# Patient Record
Sex: Female | Born: 1990 | Race: Black or African American | Hispanic: No | Marital: Single | Smoking: Never smoker
Health system: Southern US, Community
[De-identification: ages and names within clinical notes are randomized; demographics above are authoritative.]

## PROBLEM LIST (undated history)

## (undated) ENCOUNTER — Inpatient Hospital Stay (HOSPITAL_COMMUNITY): Payer: Self-pay

## (undated) DIAGNOSIS — Z789 Other specified health status: Secondary | ICD-10-CM

## (undated) HISTORY — PX: NO PAST SURGERIES: SHX2092

## (undated) HISTORY — DX: Other specified health status: Z78.9

---
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
## 2014-10-08 NOTE — L&D Delivery Note (Signed)
Patient is 24 y.o. G1P0 [redacted]w[redacted]d admitted in active labor with SROM and complete.   Delivery Note At 3:18 AM a viable female was delivered via Vaginal, Spontaneous Delivery (Presentation: LOA).  APGAR: 8, 9; weight pending.  Placenta status: Intact, Spontaneous.  Cord: 3 vessels with the following complications: None.   Anesthesia: None  Episiotomy: None Lacerations: None Est. Blood Loss (mL): 200  Upon arrival patient was complete, pushing, and head present at perineum. She had a precipitous delivery. Baby delivered without difficulty, was noted to have good tone and place on maternal abdomen for oral suctioning, drying and stimulation. Delayed cord clamping performed. Placenta delivered intact with 3V cord. Vaginal canal and perineum was inspected and hemostatic. IM Pitocin  x2 given as well as  cytotec rectally due to continued bleeding. Patient had no IV access.  Mom to postpartum.  Baby to Couplet care / Skin to Skin.   Caryl Ada, DO 05/29/2015, 3:34 AM PGY-2, [facility]

## 2014-10-11 ENCOUNTER — Emergency Department (HOSPITAL_COMMUNITY): Payer: Medicaid Other

## 2014-10-11 ENCOUNTER — Encounter (HOSPITAL_COMMUNITY): Payer: Self-pay | Admitting: *Deleted

## 2014-10-11 ENCOUNTER — Emergency Department (HOSPITAL_COMMUNITY)
Admission: EM | Admit: 2014-10-11 | Discharge: 2014-10-11 | Disposition: A | Discharge status: Home / Self Care | Payer: Medicaid Other | Attending: Emergency Medicine | Admitting: Emergency Medicine

## 2014-10-11 DIAGNOSIS — Z3401 Encounter for supervision of normal first pregnancy, first trimester: Secondary | ICD-10-CM

## 2014-10-11 DIAGNOSIS — Z3A09 9 weeks gestation of pregnancy: Secondary | ICD-10-CM | POA: Insufficient documentation

## 2014-10-11 DIAGNOSIS — R1030 Lower abdominal pain, unspecified: Secondary | ICD-10-CM | POA: Diagnosis not present

## 2014-10-11 DIAGNOSIS — R109 Unspecified abdominal pain: Secondary | ICD-10-CM

## 2014-10-11 DIAGNOSIS — O9989 Other specified diseases and conditions complicating pregnancy, childbirth and the puerperium: Secondary | ICD-10-CM | POA: Diagnosis present

## 2014-10-11 DIAGNOSIS — O26899 Other specified pregnancy related conditions, unspecified trimester: Secondary | ICD-10-CM

## 2014-10-11 LAB — CBC WITH DIFFERENTIAL/PLATELET
BASOS ABS: 0 10*3/uL (ref 0.0–0.1)
Basophils Relative: 0 % (ref 0–1)
Eosinophils Absolute: 0.1 10*3/uL (ref 0.0–0.7)
Eosinophils Relative: 1 % (ref 0–5)
HEMATOCRIT: 39.4 % (ref 36.0–46.0)
HEMOGLOBIN: 13.2 g/dL (ref 12.0–15.0)
LYMPHS PCT: 19 % (ref 12–46)
Lymphs Abs: 2 10*3/uL (ref 0.7–4.0)
MCH: 28.3 pg (ref 26.0–34.0)
MCHC: 33.5 g/dL (ref 30.0–36.0)
MCV: 84.4 fL (ref 78.0–100.0)
MONO ABS: 0.5 10*3/uL (ref 0.1–1.0)
Monocytes Relative: 4 % (ref 3–12)
NEUTROS ABS: 8.1 10*3/uL — AB (ref 1.7–7.7)
Neutrophils Relative %: 76 % (ref 43–77)
Platelets: 326 10*3/uL (ref 150–400)
RBC: 4.67 MIL/uL (ref 3.87–5.11)
RDW: 13 % (ref 11.5–15.5)
WBC: 10.7 10*3/uL — AB (ref 4.0–10.5)

## 2014-10-11 LAB — WET PREP, GENITAL
Clue Cells Wet Prep HPF POC: NONE SEEN
Trich, Wet Prep: NONE SEEN
WBC WET PREP: NONE SEEN
YEAST WET PREP: NONE SEEN

## 2014-10-11 LAB — COMPREHENSIVE METABOLIC PANEL
ALBUMIN: 4.4 g/dL (ref 3.5–5.2)
ALT: 12 U/L (ref 0–35)
ANION GAP: 9 (ref 5–15)
AST: 21 U/L (ref 0–37)
Alkaline Phosphatase: 27 U/L — ABNORMAL LOW (ref 39–117)
BILIRUBIN TOTAL: 0.5 mg/dL (ref 0.3–1.2)
BUN: 11 mg/dL (ref 6–23)
CHLORIDE: 107 meq/L (ref 96–112)
CO2: 19 mmol/L (ref 19–32)
CREATININE: 0.66 mg/dL (ref 0.50–1.10)
Calcium: 9.2 mg/dL (ref 8.4–10.5)
GFR calc Af Amer: >90 mL/min (ref >90)
Glucose, Bld: 82 mg/dL (ref 70–99)
POTASSIUM: 3.2 mmol/L — AB (ref 3.5–5.1)
Sodium: 135 mmol/L (ref 135–145)
Total Protein: 7.8 g/dL (ref 6.0–8.3)

## 2014-10-11 LAB — URINALYSIS, ROUTINE W REFLEX MICROSCOPIC
Bilirubin Urine: NEGATIVE
GLUCOSE, UA: NEGATIVE mg/dL
HGB URINE DIPSTICK: NEGATIVE
KETONES UR: NEGATIVE mg/dL
Leukocytes, UA: NEGATIVE
Nitrite: NEGATIVE
PROTEIN: NEGATIVE mg/dL
Specific Gravity, Urine: 1.027 (ref 1.005–1.030)
Urobilinogen, UA: 1 mg/dL (ref 0.0–1.0)
pH: 6.5 (ref 5.0–8.0)

## 2014-10-11 LAB — POC URINE PREG, ED: Preg Test, Ur: POSITIVE — AB

## 2014-10-11 LAB — HCG, QUANTITATIVE, PREGNANCY: HCG, BETA CHAIN, QUANT, S: 9940 m[IU]/mL — AB (ref <5)

## 2014-10-11 IMAGING — US US OB COMP LESS 14 WK
1 series · 13 of 28 positions shown · non-contrast
Comparison: None.

CLINICAL DATA: Lower pelvic pain. History of ovarian cysts. Nine
weeks and 1 day pregnant by last menstrual period. Quantitative beta
HCG [DATE].

EXAM:
OBSTETRIC <14 WK US AND TRANSVAGINAL OB US
TECHNIQUE: Both transabdominal and transvaginal ultrasound examinations were
performed for complete evaluation of the gestation as well as the
maternal uterus, adnexal regions, and pelvic cul-de-sac.
Transvaginal technique was performed to assess early pregnancy.

[Series 1: us ob comp less 14 wk · 0.18mm/px · 13 of 45 slices shown]
[im 2/45]
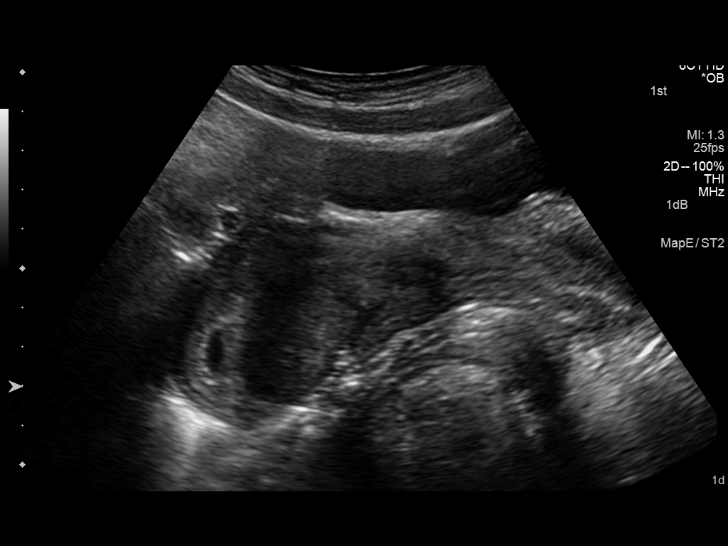
[im 5/45]
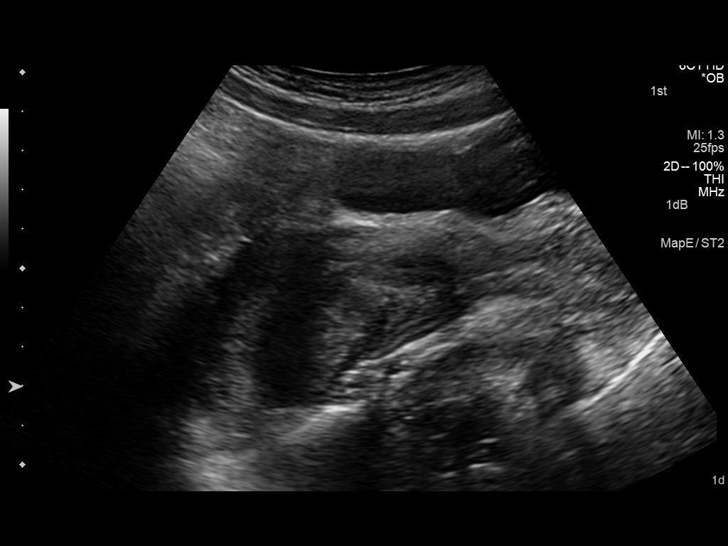
[im 9/45]
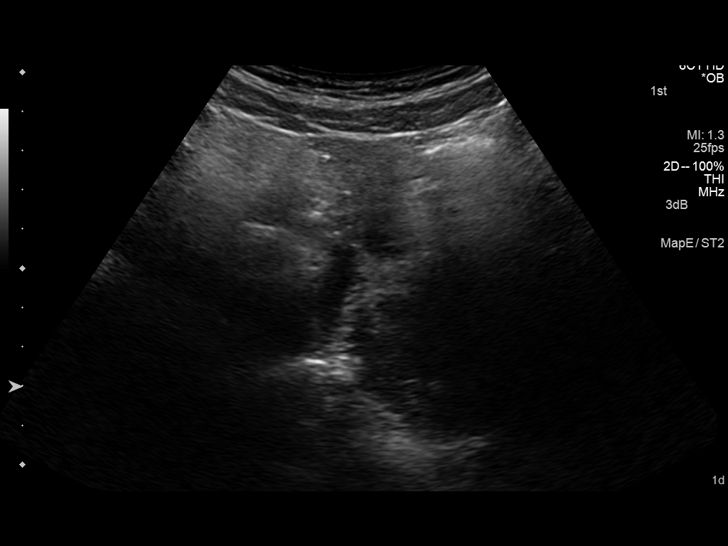
[im 12/45]
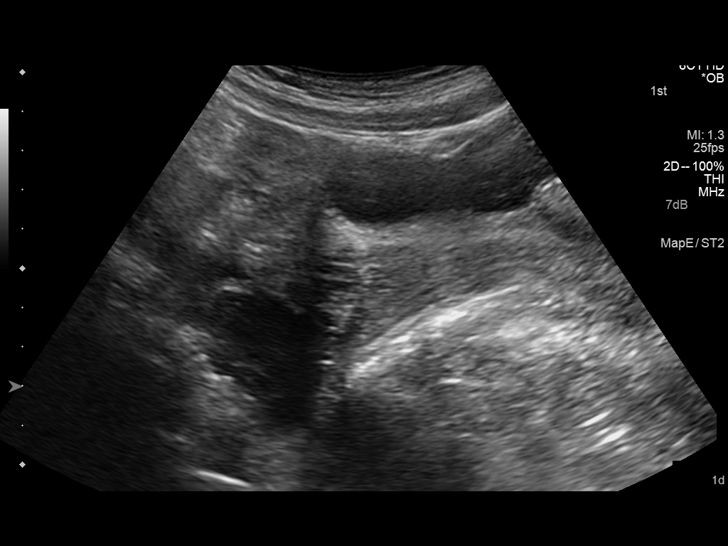
[im 15/45]
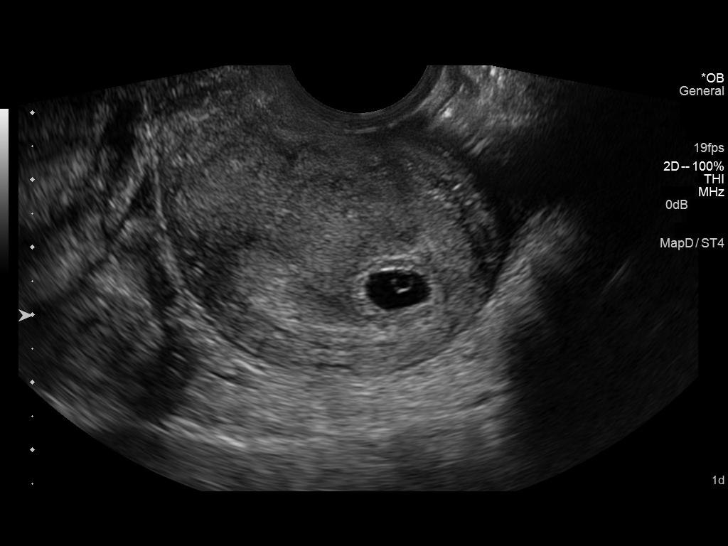
[im 18/45]
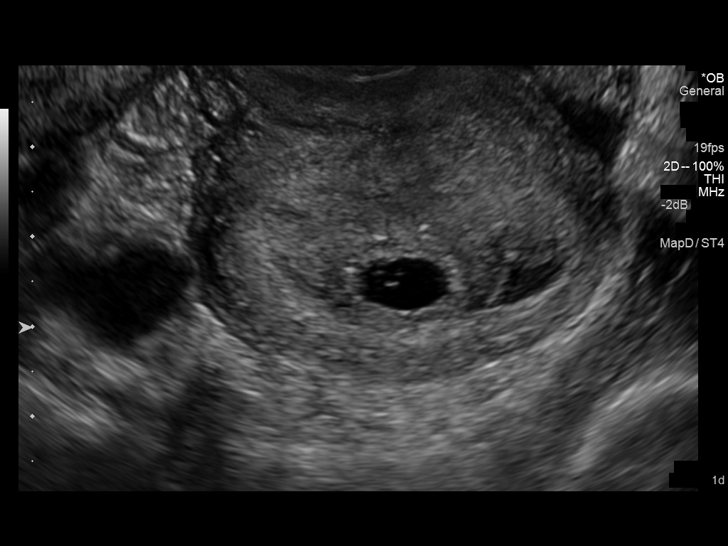
[im 23/45]
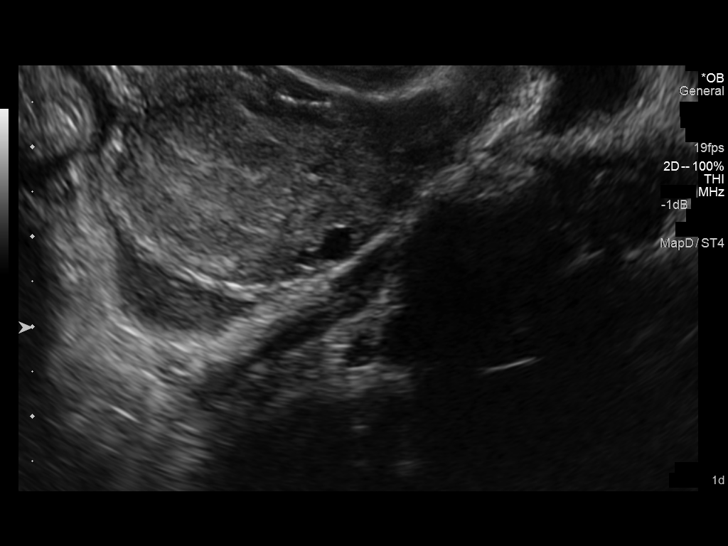
[im 27/45]
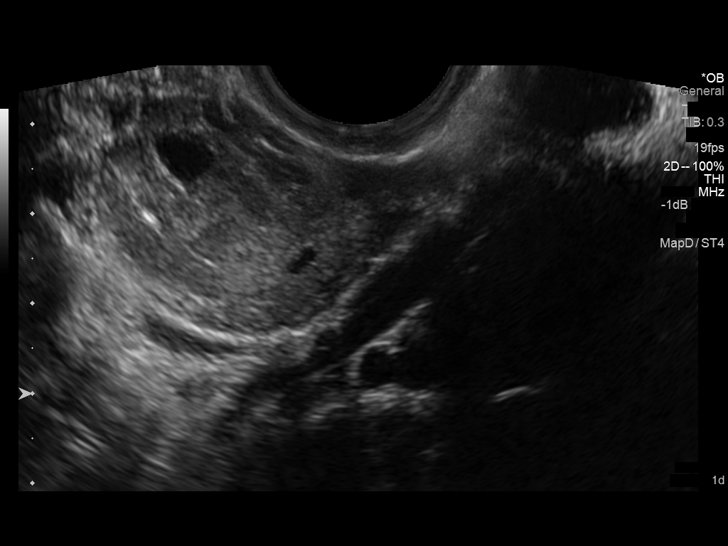
[im 30/45]
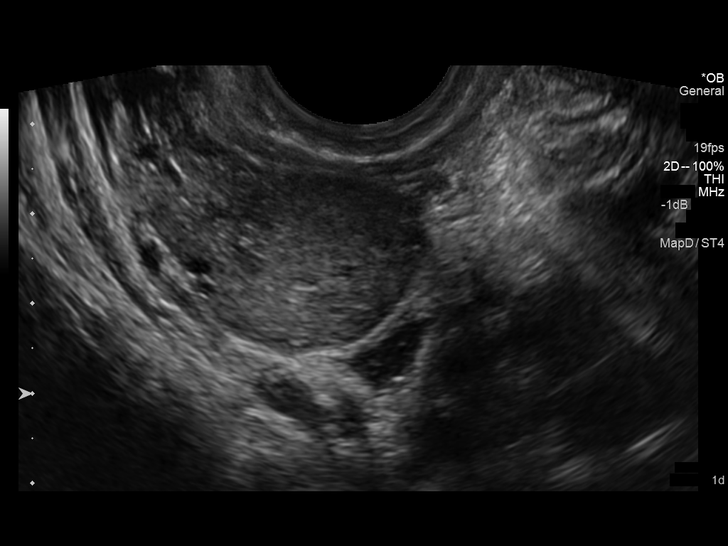
[im 33/45]
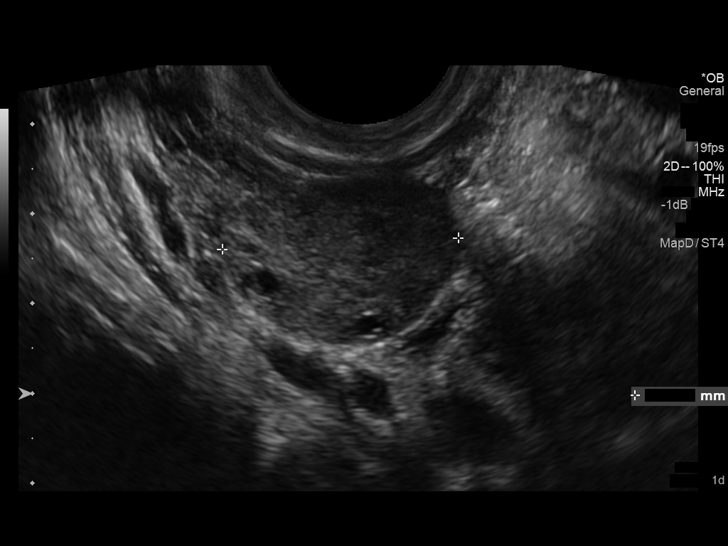
[im 36/45]
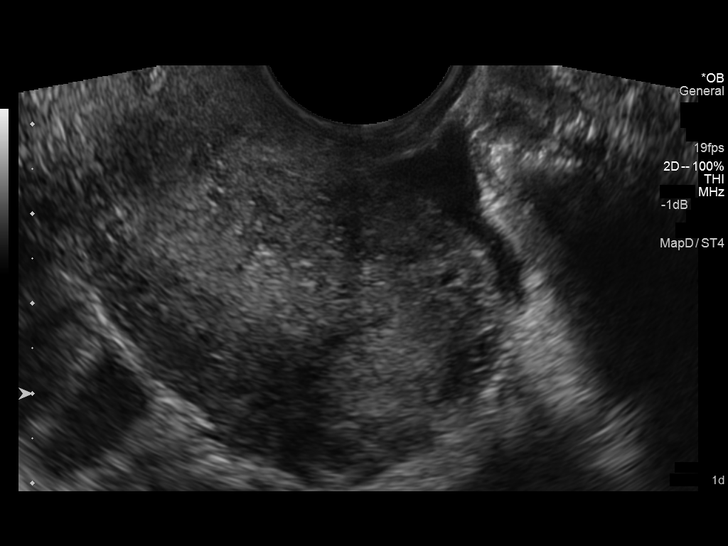
[im 40/45]
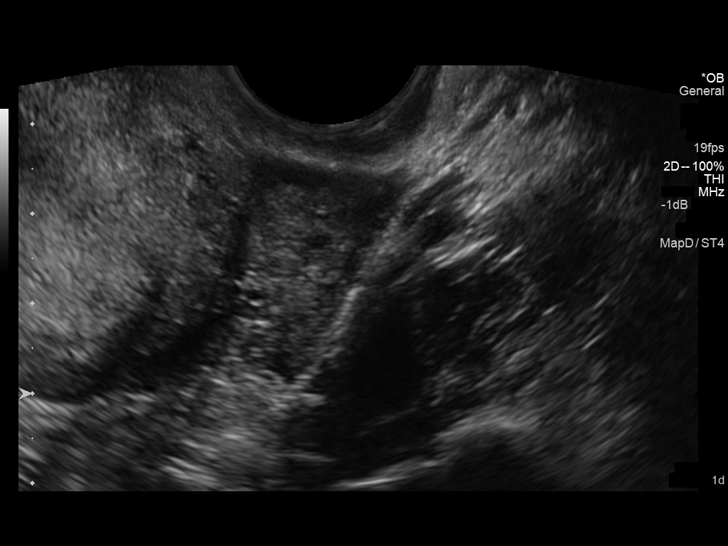
[im 43/45]
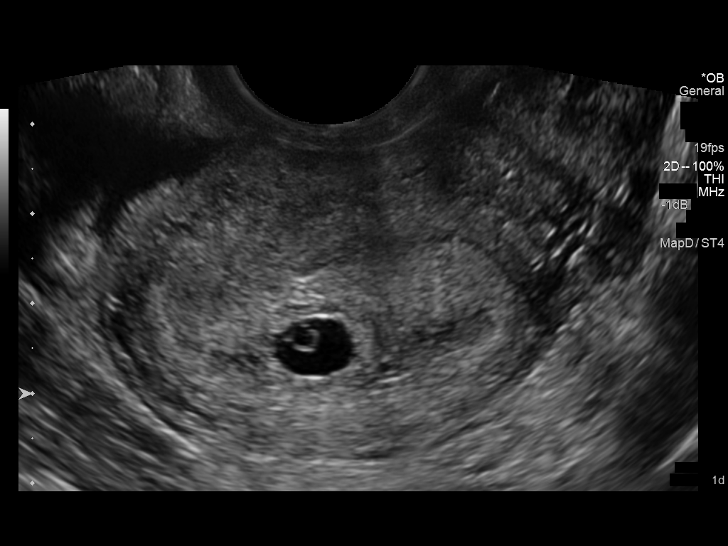

[13 of 28 positions shown; findings below may reference images not displayed]

FINDINGS: Intrauterine gestational sac: Visualized/normal in shape.

Yolk sac:  Visualized/normal in shape

Embryo:  Not visualized

Cardiac Activity: Not visualized

MSD:  9.4  mm   5 w   5  d                 US EDC: [DATE].

Maternal uterus/adnexae: Small subchorionic hemorrhage. Normal
appearing ovaries. Trace free peritoneal fluid.
IMPRESSION: 1. Intrauterine gestational sac with an estimated gestational age of
5 weeks and 5 days. There is no fetal pole visible at this time.
This most likely represents a normal early intrauterine pregnancy.
Recommend follow-up quantitative B-HCG levels and follow-up US in 14
days to confirm and assess viability. This recommendation follows
SRU consensus guidelines: Diagnostic Criteria for Nonviable
Pregnancy Early in the First Trimester. N Engl J Med [YM];
[DATE].
2. Small subchorionic hemorrhage.

## 2014-10-11 MED ORDER — ACETAMINOPHEN 500 MG PO TABS
1000.0000 mg | ORAL_TABLET | Freq: Once | ORAL | Status: AC
  Administered 2014-10-11: 1000 mg via ORAL
  Filled 2014-10-11: qty 2

## 2014-10-11 MED ORDER — PRENATAL COMPLETE 14-0.4 MG PO TABS: 1.0000 | ORAL_TABLET | Freq: Every day | ORAL | Status: DC

## 2014-10-11 NOTE — Discharge Instructions (Signed)
Follow with OB/GYN as soon as possible.   Take acetaminophen (Tylenol) up to 975 mg (this is normally 3 over-the-counter pills) up to 3 times a day. Do not drink alcohol. Make sure your other medications do not contain acetaminophen (Read the labels!)  Do NOT take any NSAIDs, such as Aspirin, Motrin, Ibuprofen, Aleve, Naproxen etc. Only take Tylenol for pain. Return to the emergency room  for any severe abdominal pain, increasing vaginal bleeding, passing out or repeated vomiting.  Please go to the [facility] office in [facility] to apply for coverage. Alternatively, you can could go to the DSS office in [facility] to apply for emergency coverage.   Do not hesitate to return to the emergency room for any new, worsening or concerning symptoms.  Please obtain primary care using resource guide below. But the minute you were seen in the emergency room and that they will need to obtain records for further outpatient management.   Abdominal Pain During Pregnancy Belly (abdominal) pain is common during pregnancy. Most of the time, it is not a serious problem. Other times, it can be a sign that something is wrong with the pregnancy. Always tell your doctor if you have belly pain. HOME CARE Monitor your belly pain for any changes. The following actions may help you feel better:  Do not have sex (intercourse) or put anything in your vagina until you feel better.  Rest until your pain stops.  Drink clear fluids if you feel sick to your stomach (nauseous). Do not eat solid food until you feel better.  Only take medicine as told by your doctor.  Keep all doctor visits as told. GET HELP RIGHT AWAY IF:   You are bleeding, leaking fluid, or pieces of tissue come out of your vagina.  You have more pain or cramping.  You keep throwing up (vomiting).  You have pain when you pee (urinate) or have blood in your pee.  You have a fever.  You do not feel your baby moving as much.  You feel very weak  or feel like passing out.  You have trouble breathing, with or without belly pain.  You have a very bad headache and belly pain.  You have fluid leaking from your vagina and belly pain.  You keep having watery poop (diarrhea).  Your belly pain does not go away after resting, or the pain gets worse. MAKE SURE YOU:   Understand these instructions.  Will watch your condition.  Will get help right away if you are not doing well or get worse. Document Released: 09/12/2009 Document Revised: 05/27/2013 Document Reviewed: 04/23/2013 [facility] Patient Information 2015 [place], [place]. This information is not intended to replace advice given to you by your health care provider. Make sure you discuss any questions you have with your health care provider.   Emergency Department Resource Guide 1) Find a Doctor and Pay Out of Pocket Although you won't have to find out who is covered by your insurance plan, it is a good idea to ask around and get recommendations. You will then need to call the office and see if the doctor you have chosen will accept you as a new patient and what types of options they offer for patients who are self-pay. Some doctors offer discounts or will set up payment plans for their patients who do not have insurance, but you will need to ask so you aren't surprised when you get to your appointment.  2) Contact Your Local Health Department Not all  health departments have doctors that can see patients for sick visits, but many do, so it is worth a call to see if yours does. If you don't know where your local health department is, you can check in your phone book. The CDC also has a tool to help you locate your state's health department, and many state websites also have listings of all of their local health departments.  3) Find a Walk-in Clinic If your illness is not likely to be very severe or complicated, you may want to try a walk in clinic. These are popping up all over the  country in pharmacies, drugstores, and shopping centers. They're usually staffed by nurse practitioners or physician assistants that have been trained to treat common illnesses and complaints. They're usually fairly quick and inexpensive. However, if you have serious medical issues or chronic medical problems, these are probably not your best option.  No Primary Care Doctor: - Call Health Connect at  [phone] - they can help you locate a primary care doctor that  accepts your insurance, provides certain services, etc. - Physician Referral Service- [phone]  Chronic Pain Problems: Organization         Address  Phone   Notes  [facility]  [phone] Patients need to be referred by their primary care doctor.   Medication Assistance: Organization         Address  Phone   Notes  [facility] Medication [facility] [address] [place], [place] [address] [phone] --Must be a resident of [facility] -- Must have NO insurance coverage whatsoever (no Medicaid/ Medicare, etc.) -- The pt. MUST have a primary care doctor that directs their care regularly and follows them in the community   [facility]ist  [phone]   [employer]  [phone]    Agencies that provide inexpensive medical care: Organization         Address  Phone   Notes  [facility]  [phone]   [facility]    [phone]   [facility] [address] [place], [place] [address] [phone]   [facility] [address], [place] [phone]   Planned Parenthood    [phone]   [facility]    [phone]   [facility]  [address], [place] Phone:  [phone], Fax:  [phone] Hours of Operation:  9 am - 6 pm, M-F.  Also accepts Medicaid/Medicare and self-pay.  [facility]  [address], [place] Phone: [phone], Fax: [phone]. Hours of Operation:  8:30 am - 5:30 pm, M-F.  Also accepts Medicaid and self-pay.  [facility] [place] [address], [place] Phone: [phone]   [facility] [address] Natasha Bence [place], [place] [phone], Ext. [phone] Mondays & Thursdays: 7-9 AM.  First 15 patients are seen on a first come, first serve basis.    Medicaid-accepting [facility] Providers:  Organization         Address  Phone   Notes  [facility] [address], [place] [phone] Also accepts self-pay patients.  [facility] [address] Laurell Josephs [place], [place]  [phone]   [facility] [address], [place] [phone]   [facility] [address] [place] [phone]   Renaye Rakers [address], [place]   [phone] Only accepts [place] Access [place] patients after they have their name applied to their card.   Self-Pay (no insurance) in [facility]:  Organization         Address  Phone   Notes  Sickle Cell Patients, [facility] [address] [place], [place] [phone]   [facility] [address] [place], [place] [phone]   [facility] [place]  [address],  [phone]   [facility]/Dr. Osei-Bonsu  [address] [place] or [address], [place] [phone] Phone number for both [place] and [place] locations is the same.  [facility] [address] [place] [phone]   [facility] [address], [place] or [address] [phone]   [facility] [address], [place] [phone], phone; [phone], fax Sees patients 1st and 3rd Saturday of every month.  Must  not qualify for public or private insurance (i.e. Medicaid, Medicare, [place] Health Choice, Veterans' Benefits)  Household income should be no more than 200% of the poverty level The clinic cannot treat you if you are pregnant or think you are pregnant  Sexually transmitted diseases are not treated at the clinic.    Dental Care: Organization         Address  Phone  Notes  [facility] [address] [place], [place] [phone] Accepts children up to age 57 who are enrolled in [place] or [place] Health Choice; pregnant women with a Medicaid card; and children who have applied for Medicaid or [place] Health Choice, but were declined, whose parents can pay a reduced fee at time of service.  [facility] Department of [facility]  [address], [place] [phone] Accepts children up to age 66 who are enrolled in [place] or [place] Health Choice; pregnant women with a Medicaid card; and children who have applied for Medicaid or [place] Health Choice, but were declined, whose parents can pay a reduced fee at time of service.  [facility]  [address] [place], [place] [phone] Patients are seen by appointment only. Walk-ins are not accepted. [facility] will see patients 35 years of age and older. Monday - Tuesday (8am-5pm) Most Wednesdays (8:30-5pm) $30 per visit, cash only  [facility] Adult Dental Access PROGRAM  [address], [facility] [phone] Patients are seen by appointment only. Walk-ins are not accepted. [facility] will see patients 40 years of age and older. One Wednesday Evening (Monthly: Volunteer Based).  $30 per visit, cash only  [employer]  [phone] for adults; Children under age 11, call Graduate Pediatric Dentistry at [phone]. Children aged 38-14, please call [phone] to request a pediatric application.  Dental services are  provided in all areas of dental care including fillings, crowns and bridges, complete and partial dentures, implants, gum treatment, root canals, and extractions. Preventive care is also provided. Treatment is provided to both adults and children. Patients are selected via a lottery and there is often a waiting list.   [facility] [address] [place]  [phone] [email or web address]   [facility] [address] [place], [place] [phone], Ext. [phone] Second and Fourth Thursday of each month, opens at 6:30 AM; Clinic ends at 9 AM.  Patients are seen on a first-come first-served basis, and a limited number are seen during each clinic.   [facility]  [address] Ether Griffins [place], [place] [phone]   Eligibility Requirements You must have lived in [place], [place], or [place] counties for at least the last three months.   You cannot be eligible for state or federal sponsored [place], including [employer], [place], or [employer].   You generally cannot be eligible for healthcare insurance through your employer.    How to apply: Eligibility screenings are held every Tuesday and Wednesday afternoon from 1:00 pm until 4:00 pm. You do not need an appointment for the interview!  [facility] [address] [place], [place] [phone]   [facility]  [phone]   [facility] Health [facility]  [phone]   [facility]  [phone]    Behavioral Health Resources in the Community: Intensive Outpatient Programs Organization         Address  Phone  Notes  [facility] [address] [place], [place] [phone]   [facility] Outpatient [address] [place], [place] [phone]   [facility] [address] [place], [place]  [phone]   [facility] Mental Health [address],  [place], [place]  [phone] or [phone]   Substance Abuse Resources Organization         Address  Phone  Notes  [facility]  [phone]   [facility]  [phone]   [facility] [place]  [phone]   Floydene Flock  [phone]   Residential & Outpatient Substance Abuse Program  [phone]   Psychological Services Organization         Address  Phone  Notes  [facility]  [phone]   [facility] Services  [phone]   [facility] Mental Health [address], [place] [phone] or [phone]    Mobile Crisis Teams Organization         Address  Phone  Notes  [facility], Mobile Crisis Care Unit  [phone]   [facility]  [address] [place], [place] [phone]   Doristine Locks [address] [place] [phone]    Self-Help/Support Groups Organization         Address  Phone             Notes  [facility] [place] - variety of support groups  336- I7437963 Call for more information  Narcotics Anonymous (NA), [facility] [address], [employer] [place]  2 meetings at this location   Statistician         Address  Phone  Notes  [facility] [address],    [place]  [phone]   [facility]  [address] [place] [address], [place], [place] [phone]   [facility] Treatment Facility [address] [place], [place] [phone] Admissions: 8am-3pm M-F  [facility] [address]    [place], [place] [phone]   [facility] [address] Starling Manns [place], [place] [phone]   [facility] [address]  [place], [place] [phone]   [facility] - Intensive Outpatient [phone] Laurell Josephs  [address], [place], [place] [phone]   [facility] (Addiction [facility] [address] [place]  [place], [place] [phone] or  [phone]   [facility] ([facility]) [address], [place], [place] [phone] Accepts Medicaid  [facility] [address],  [place] [phone] Substance Abuse/Addiction Treatment   [facility] Organization         Address  Phone  Notes  [facility]  [phone]   Angie Fava, PhD [address] Ervin Knack [place], [place]   [phone] or [phone]   [facility] Behavioral   [address] [place], [place] [phone]   [facility] [address], [place], [place] [phone] Insurance/Medicaid/sponsorship through [facility] and Families [address]                                    [place], [place] [phone] Therapy/tele-psych/case  [facility] [address], [place] [phone]    Dr. Lolly Mustache  [phone]   [facility]  [employer] [facility] Dept. 1) [address], [place] 2) [address] [place] 3)  [address], [place] [phone]  [phone]   [facility] Child Abuse Hotline (231) 127-9783 or [phone] (After Hours)

## 2014-10-11 NOTE — ED Notes (Signed)
Pt reports having lower abd cramping x 2 weeks. Pt is approx 5-[redacted] weeks pregnant. Denies any vaginal discharge, bleeding or pain with urination.

## 2014-10-11 NOTE — ED Provider Notes (Signed)
CSN: [id number]     Arrival date & time 10/11/14  1534 History   First MD Initiated Contact with Patient 10/11/14 1810     Chief Complaint  Patient presents with  . Abdominal Pain     (Consider location/radiation/quality/duration/timing/severity/associated sxs/prior Treatment) HPI  Iyanni Hepp is a 24 y.o. female Prima para complaining of midline lower abdominal pain which she rates at 7 out of 10 x2 weeks, last menstrual period was in mid November. Patient has positive urine home pregnancy test. Patient has been taking naproxen at home for pain control. She denies vaginal discharge, vaginal bleeding, dysuria, hematuria, urinary frequency, vomiting, syncope, fever, chills, chest pain, shortness of breath, headaches.   History reviewed. No pertinent past medical history. History reviewed. No pertinent past surgical history. History reviewed. No pertinent family history. History  Substance Use Topics  . Smoking status: Not on file  . Smokeless tobacco: Not on file  . Alcohol Use: No   OB History    No data available     Review of Systems  10 systems reviewed and found to be negative, except as noted in the HPI.   Allergies  Review of patient's allergies indicates no known allergies.  Home Medications   Prior to Admission medications   Medication Sig Start Date End Date Taking? Authorizing Provider  Prenatal Vit-Fe Fumarate-FA (PRENATAL COMPLETE) 14-0.4 MG TABS Take 1 tablet by mouth daily. 10/11/14   Jacquilyn Seldon, PA-C   BP 114/68 mmHg  Pulse 76  Temp(Src) 98.1 F (36.7 C)  Resp 16  Wt 125 lb (56.7 kg)  SpO2 99%  LMP 08/08/2014 Physical Exam  Constitutional: She is oriented to person, place, and time. She appears well-developed and well-nourished. No distress.  HENT:  Head: Normocephalic.  Mouth/Throat: Oropharynx is clear and moist.  Eyes: Conjunctivae and EOM are normal. Pupils are equal, round, and reactive to light.  Neck: Normal range of motion.   Cardiovascular: Normal rate.   Pulmonary/Chest: Effort normal and breath sounds normal. No stridor.  Abdominal: Soft. Bowel sounds are normal. She exhibits no distension and no mass. There is no tenderness. There is no rebound and no guarding.  Genitourinary:  Pelvic exam is chaperoned by nurse Brittney: No rashes or lesions, no abnormal vaginal discharge, no cervical motion or adnexal tenderness.  Musculoskeletal: Normal range of motion. She exhibits no edema.  Neurological: She is alert and oriented to person, place, and time.  Psychiatric: She has a normal mood and affect.  Nursing note and vitals reviewed.   ED Course  Procedures (including critical care time) Labs Review Labs Reviewed  HCG, QUANTITATIVE, PREGNANCY - Abnormal; Notable for the following:    hCG, Beta Chain, Quant, S 9940 (*)    All other components within normal limits  CBC WITH DIFFERENTIAL - Abnormal; Notable for the following:    WBC 10.7 (*)    Neutro Abs 8.1 (*)    All other components within normal limits  COMPREHENSIVE METABOLIC PANEL - Abnormal; Notable for the following:    Potassium 3.2 (*)    Alkaline Phosphatase 27 (*)    All other components within normal limits  POC URINE PREG, ED - Abnormal; Notable for the following:    Preg Test, Ur POSITIVE (*)    All other components within normal limits  WET PREP, GENITAL  GC/CHLAMYDIA PROBE AMP  URINALYSIS, ROUTINE W REFLEX MICROSCOPIC    Imaging Review US Ob Comp Less 14 Wks  10/11/2014   CLINICAL DATA:  Lower pelvic  pain. History of ovarian cysts. Nine weeks and 1 day pregnant by last menstrual period. Quantitative beta HCG 9,940.  EXAM: OBSTETRIC <14 WK [place] AND TRANSVAGINAL OB US  TECHNIQUE: Both transabdominal and transvaginal ultrasound examinations were performed for complete evaluation of the gestation as well as the maternal uterus, adnexal regions, and pelvic cul-de-sac. Transvaginal technique was performed to assess early pregnancy.  COMPARISON:   None.  FINDINGS: Intrauterine gestational sac: Visualized/normal in shape.  Yolk sac:  Visualized/normal in shape  Embryo:  Not visualized  Cardiac Activity: Not visualized  MSD:  9.4  mm   5 w   5  d                 [place] EDC: 06/08/2015.  Maternal uterus/adnexae: Small subchorionic hemorrhage. Normal appearing ovaries. Trace free peritoneal fluid.  IMPRESSION: 1. Intrauterine gestational sac with an estimated gestational age of [redacted] weeks and 5 days. There is no fetal pole visible at this time. This most likely represents a normal early intrauterine pregnancy. Recommend follow-up quantitative B-HCG levels and follow-up US in 14 days to confirm and assess viability. This recommendation follows SRU consensus guidelines: Diagnostic Criteria for Nonviable Pregnancy Early in the First Trimester. Malva Limes Med 2013; 161:0960-45. 2. Small subchorionic hemorrhage.   Electronically Signed   By: Gordan Payment M.D.   On: 10/11/2014 20:15   US Ob Transvaginal  10/11/2014   CLINICAL DATA:  Lower pelvic pain. History of ovarian cysts. Nine weeks and 1 day pregnant by last menstrual period. Quantitative beta HCG 9,940.  EXAM: OBSTETRIC <14 WK [place] AND TRANSVAGINAL OB US  TECHNIQUE: Both transabdominal and transvaginal ultrasound examinations were performed for complete evaluation of the gestation as well as the maternal uterus, adnexal regions, and pelvic cul-de-sac. Transvaginal technique was performed to assess early pregnancy.  COMPARISON:  None.  FINDINGS: Intrauterine gestational sac: Visualized/normal in shape.  Yolk sac:  Visualized/normal in shape  Embryo:  Not visualized  Cardiac Activity: Not visualized  MSD:  9.4  mm   5 w   5  d                 [place] EDC: 06/08/2015.  Maternal uterus/adnexae: Small subchorionic hemorrhage. Normal appearing ovaries. Trace free peritoneal fluid.  IMPRESSION: 1. Intrauterine gestational sac with an estimated gestational age of [redacted] weeks and 5 days. There is no fetal pole visible at this time. This  most likely represents a normal early intrauterine pregnancy. Recommend follow-up quantitative B-HCG levels and follow-up US in 14 days to confirm and assess viability. This recommendation follows SRU consensus guidelines: Diagnostic Criteria for Nonviable Pregnancy Early in the First Trimester. Malva Limes Med 2013; 409:8119-14. 2. Small subchorionic hemorrhage.   Electronically Signed   By: Gordan Payment M.D.   On: 10/11/2014 20:15     EKG Interpretation None      MDM   Final diagnoses:  Abdominal pain affecting pregnancy  Pregnancy, first, first trimester    Filed Vitals:   10/11/14 1900 10/11/14 1915 10/11/14 1930 10/11/14 2030  BP: 115/72 138/69 112/68 114/68  Pulse: 88 83 92 76  Temp:      Resp:  16  16  Weight:      SpO2: 100% 100% 100% 99%    Medications  acetaminophen (TYLENOL) tablet 1,000 mg (1,000 mg Oral Given 10/11/14 2027)    Inice Sanluis is a pleasant 24 y.o. female presenting with midline lower abdominal pain onset 2 weeks ago. Patient is approximately  [redacted] weeks pregnant, has not had OB/GYN evaluation. Serial abdominal exams are benign, ultrasound shows intrauterine gestational sac no fetal pole; with subchorionic hemorrhage.  Discussed with patient that she will need a recheck blood level in 14 days, I have instructed her to go to [facility] for this, or if she has any issues to return to the ED. Patient verbalizes her understanding.  UA and wet prep with no signs of infection. GC and chlamydia pending. I have advised her that she will need to ensure that we have a good phone number to reach her at as these tests results will come back several days from now.  OB/GYN consult from Dr. Adrian Blackwater appreciated: We have discussed the small subchorionic hemorrhage he has confirmed that there is no intervention or specific guidance that can be given on this at this time. Patient will follow at the [facility] outpatient clinic. I will start her on prenatal vitamins. Advised her  not to take any NSAIDs, only APAP for pain.   Evaluation does not show pathology that would require ongoing emergent intervention or inpatient treatment. Pt is hemodynamically stable and mentating appropriately. Discussed findings and plan with patient/guardian, who agrees with care plan. All questions answered. Return precautions discussed and outpatient follow up given.   New Prescriptions   PRENATAL VIT-FE FUMARATE-FA (PRENATAL COMPLETE) 14-0.4 MG TABS    Take 1 tablet by mouth daily.         Wynetta Emery, PA-C 10/11/14 2156  Gerhard Munch, MD 10/11/14 2312

## 2014-10-13 LAB — GC/CHLAMYDIA PROBE AMP
CT Probe RNA: NEGATIVE
GC Probe RNA: NEGATIVE

## 2014-11-24 ENCOUNTER — Ambulatory Visit (INDEPENDENT_AMBULATORY_CARE_PROVIDER_SITE_OTHER): Payer: Medicaid Other | Admitting: Obstetrics & Gynecology

## 2014-11-24 ENCOUNTER — Encounter: Payer: Self-pay | Admitting: Advanced Practice Midwife

## 2014-11-24 VITALS — BP 125/82 | HR 109 | Ht 67.0 in | Wt 124.2 lb

## 2014-11-24 DIAGNOSIS — Z3491 Encounter for supervision of normal pregnancy, unspecified, first trimester: Secondary | ICD-10-CM

## 2014-11-24 DIAGNOSIS — Z3490 Encounter for supervision of normal pregnancy, unspecified, unspecified trimester: Secondary | ICD-10-CM | POA: Diagnosis not present

## 2014-11-24 DIAGNOSIS — Z124 Encounter for screening for malignant neoplasm of cervix: Secondary | ICD-10-CM | POA: Diagnosis not present

## 2014-11-24 DIAGNOSIS — Z118 Encounter for screening for other infectious and parasitic diseases: Secondary | ICD-10-CM | POA: Diagnosis not present

## 2014-11-24 DIAGNOSIS — Z113 Encounter for screening for infections with a predominantly sexual mode of transmission: Secondary | ICD-10-CM | POA: Diagnosis not present

## 2014-11-24 LAB — POCT URINALYSIS DIP (DEVICE)
BILIRUBIN URINE: NEGATIVE
Glucose, UA: NEGATIVE mg/dL
HGB URINE DIPSTICK: NEGATIVE
KETONES UR: NEGATIVE mg/dL
Leukocytes, UA: NEGATIVE
Nitrite: NEGATIVE
PH: 7 (ref 5.0–8.0)
Protein, ur: 30 mg/dL — AB
SPECIFIC GRAVITY, URINE: 1.02 (ref 1.005–1.030)
Urobilinogen, UA: 1 mg/dL (ref 0.0–1.0)

## 2014-11-24 NOTE — Progress Notes (Signed)
Not taking prenatal vitamin makes her feel sick.

## 2014-11-24 NOTE — Patient Instructions (Signed)
First Trimester of Pregnancy The first trimester of pregnancy is from week 1 until the end of week 12 (months 1 through 3). A week after a sperm fertilizes an egg, the egg will implant on the wall of the uterus. This embryo will begin to develop into a baby. Genes from you and your partner are forming the baby. The female genes determine whether the baby is a boy or a girl. At 6-8 weeks, the eyes and face are formed, and the heartbeat can be seen on ultrasound. At the end of 12 weeks, all the baby's organs are formed.  Now that you are pregnant, you will want to do everything you can to have a healthy baby. Two of the most important things are to get good prenatal care and to follow your health care provider's instructions. Prenatal care is all the medical care you receive before the baby's birth. This care will help prevent, find, and treat any problems during the pregnancy and childbirth. BODY CHANGES Your body goes through many changes during pregnancy. The changes vary from woman to woman.   You may gain or lose a couple of pounds at first.  You may feel sick to your stomach (nauseous) and throw up (vomit). If the vomiting is uncontrollable, call your health care provider.  You may tire easily.  You may develop headaches that can be relieved by medicines approved by your health care provider.  You may urinate more often. Painful urination may mean you have a bladder infection.  You may develop heartburn as a result of your pregnancy.  You may develop constipation because certain hormones are causing the muscles that push waste through your intestines to slow down.  You may develop hemorrhoids or swollen, bulging veins (varicose veins).  Your breasts may begin to grow larger and become tender. Your nipples may stick out more, and the tissue that surrounds them (areola) may become darker.  Your gums may bleed and may be sensitive to brushing and flossing.  Dark spots or blotches (chloasma,  mask of pregnancy) may develop on your face. This will likely fade after the baby is born.  Your menstrual periods will stop.  You may have a loss of appetite.  You may develop cravings for certain kinds of food.  You may have changes in your emotions from day to day, such as being excited to be pregnant or being concerned that something may go wrong with the pregnancy and baby.  You may have more vivid and strange dreams.  You may have changes in your hair. These can include thickening of your hair, rapid growth, and changes in texture. Some women also have hair loss during or after pregnancy, or hair that feels dry or thin. Your hair will most likely return to normal after your baby is born. WHAT TO EXPECT AT YOUR PRENATAL VISITS During a routine prenatal visit:  You will be weighed to make sure you and the baby are growing normally.  Your blood pressure will be taken.  Your abdomen will be measured to track your baby's growth.  The fetal heartbeat will be listened to starting around week 10 or 12 of your pregnancy.  Test results from any previous visits will be discussed. Your health care provider may ask you:  How you are feeling.  If you are feeling the baby move.  If you have had any abnormal symptoms, such as leaking fluid, bleeding, severe headaches, or abdominal cramping.  If you have any questions. Other tests   that may be performed during your first trimester include:  Blood tests to find your blood type and to check for the presence of any previous infections. They will also be used to check for low iron levels (anemia) and Rh antibodies. Later in the pregnancy, blood tests for diabetes will be done along with other tests if problems develop.  Urine tests to check for infections, diabetes, or protein in the urine.  An ultrasound to confirm the proper growth and development of the baby.  An amniocentesis to check for possible genetic problems.  Fetal screens for  spina bifida and Down syndrome.  You may need other tests to make sure you and the baby are doing well. HOME CARE INSTRUCTIONS  Medicines  Follow your health care provider's instructions regarding medicine use. Specific medicines may be either safe or unsafe to take during pregnancy.  Take your prenatal vitamins as directed.  If you develop constipation, try taking a stool softener if your health care provider approves. Diet  Eat regular, well-balanced meals. Choose a variety of foods, such as meat or vegetable-based protein, fish, milk and low-fat dairy products, vegetables, fruits, and whole grain breads and cereals. Your health care provider will help you determine the amount of weight gain that is right for you.  Avoid raw meat and uncooked cheese. These carry germs that can cause birth defects in the baby.  Eating four or five small meals rather than three large meals a day may help relieve nausea and vomiting. If you start to feel nauseous, eating a few soda crackers can be helpful. Drinking liquids between meals instead of during meals also seems to help nausea and vomiting.  If you develop constipation, eat more high-fiber foods, such as fresh vegetables or fruit and whole grains. Drink enough fluids to keep your urine clear or pale yellow. Activity and Exercise  Exercise only as directed by your health care provider. Exercising will help you:  Control your weight.  Stay in shape.  Be prepared for labor and delivery.  Experiencing pain or cramping in the lower abdomen or low back is a good sign that you should stop exercising. Check with your health care provider before continuing normal exercises.  Try to avoid standing for long periods of time. Move your legs often if you must stand in one place for a long time.  Avoid heavy lifting.  Wear low-heeled shoes, and practice good posture.  You may continue to have sex unless your health care provider directs you  otherwise. Relief of Pain or Discomfort  Wear a good support bra for breast tenderness.   Take warm sitz baths to soothe any pain or discomfort caused by hemorrhoids. Use hemorrhoid cream if your health care provider approves.   Rest with your legs elevated if you have leg cramps or low back pain.  If you develop varicose veins in your legs, wear support hose. Elevate your feet for 15 minutes, 3-4 times a day. Limit salt in your diet. Prenatal Care  Schedule your prenatal visits by the twelfth week of pregnancy. They are usually scheduled monthly at first, then more often in the last 2 months before delivery.  Write down your questions. Take them to your prenatal visits.  Keep all your prenatal visits as directed by your health care provider. Safety  Wear your seat belt at all times when driving.  Make a list of emergency phone numbers, including numbers for family, friends, the hospital, and police and fire departments. General Tips    Ask your health care provider for a referral to a local prenatal education class. Begin classes no later than at the beginning of month 6 of your pregnancy.  Ask for help if you have counseling or nutritional needs during pregnancy. Your health care provider can offer advice or refer you to specialists for help with various needs.  Do not use hot tubs, steam rooms, or saunas.  Do not douche or use tampons or scented sanitary pads.  Do not cross your legs for long periods of time.  Avoid cat litter boxes and soil used by cats. These carry germs that can cause birth defects in the baby and possibly loss of the fetus by miscarriage or stillbirth.  Avoid all smoking, herbs, alcohol, and medicines not prescribed by your health care provider. Chemicals in these affect the formation and growth of the baby.  Schedule a dentist appointment. At home, brush your teeth with a soft toothbrush and be gentle when you floss. SEEK MEDICAL CARE IF:   You have  dizziness.  You have mild pelvic cramps, pelvic pressure, or nagging pain in the abdominal area.  You have persistent nausea, vomiting, or diarrhea.  You have a bad smelling vaginal discharge.  You have pain with urination.  You notice increased swelling in your face, hands, legs, or ankles. SEEK IMMEDIATE MEDICAL CARE IF:   You have a fever.  You are leaking fluid from your vagina.  You have spotting or bleeding from your vagina.  You have severe abdominal cramping or pain.  You have rapid weight gain or loss.  You vomit blood or material that looks like coffee grounds.  You are exposed to German measles and have never had them.  You are exposed to fifth disease or chickenpox.  You develop a severe headache.  You have shortness of breath.  You have any kind of trauma, such as from a fall or a car accident. Document Released: 09/18/2001 Document Revised: 02/08/2014 Document Reviewed: 08/04/2013 ExitCare Patient Information 2015 [employer]. This information is not intended to replace advice given to you by your health care provider. Make sure you discuss any questions you have with your health care provider.  

## 2014-11-24 NOTE — Progress Notes (Signed)
    Subjective:    Olivia Cooper is a  24 y.o. G1P0 at 6539w0d dated by 6 week scan being seen today for her first obstetrical visit.  Patient does not intend to breast feed. Pregnancy history fully reviewed.  Patient reports nausea and vomiting occasionally, able to tolerate enough food intake. Prenatal vitamins causes the nausea; she stopped taking them.   Filed Vitals:   11/24/14 0912 11/24/14 0912  BP: 125/82   Pulse: 109   Height:  5\' 7"  (1.702 m)  Weight: 124 lb 3.2 oz (56.337 kg)     HISTORY: OB History  Gravida Para Term Preterm AB SAB TAB Ectopic Multiple Living  1             # Outcome Date GA Lbr Len/2nd Weight Sex Delivery Anes PTL Lv  1 Current              Past Medical History  Diagnosis Date  . Medical history non-contributory    Past Surgical History  Procedure Laterality Date  . No past surgeries     History reviewed. No pertinent family history.   Exam    Uterus:   12 week size  Pelvic Exam:    Perineum: No Hemorrhoids, Normal Perineum   Vulva: normal   Vagina:  normal mucosa, normal discharge   Cervix: scant bleeding following Pap, no cervical motion tenderness and nulliparous appearance   Adnexa: normal adnexa and no mass, fullness, tenderness   Bony Pelvis: average  System: Breast:  normal appearance, no masses or tenderness, Inspection negative   Skin: normal coloration and turgor, no rashes   Neurologic: oriented, normal   Extremities: normal strength, tone, and muscle mass, no deformities   HEENT PERRLA and extra ocular movement intact   Mouth/Teeth mucous membranes moist, pharynx normal without lesions and dental hygiene good   Neck supple and no masses   Cardiovascular: regular rate and rhythm   Respiratory:  appears well, vitals normal, no respiratory distress, acyanotic, normal RR, chest clear, no wheezing, crepitations, rhonchi, normal symmetric air entry   Abdomen: soft, non-tender; bowel sounds normal; no masses,  no organomegaly    Urinary: urethral meatus normal      Assessment:    Pregnancy: G1P0 Patient Active Problem List   Diagnosis Date Noted  . Supervision of normal pregnancy 11/24/2014     Plan:   Initial labs drawn, pap checked today. Patient advised to take Children's chewable vitamins; two per day in lieu of prenatal vitamins Problem list reviewed and updated. Genetic Screening discussed First Screen: ordered. Ultrasound discussed; fetal survey: to be ordered later. The nature of [facility] with multiple MDs and other Advanced Practitioners was explained to patient; also emphasized that residents, students are part of our team. Follow up in 4 weeks.  Routine obstetric precautions reviewed.    Tereso NewcomerANYANWU,Diamond Jentz A, MD 11/24/2014

## 2014-11-25 LAB — PRENATAL PROFILE (SOLSTAS)
ANTIBODY SCREEN: NEGATIVE
BASOS ABS: 0 10*3/uL (ref 0.0–0.1)
BASOS PCT: 0 % (ref 0–1)
EOS ABS: 0 10*3/uL (ref 0.0–0.7)
Eosinophils Relative: 0 % (ref 0–5)
HEMATOCRIT: 39.6 % (ref 36.0–46.0)
HEMOGLOBIN: 13.1 g/dL (ref 12.0–15.0)
HEP B S AG: NEGATIVE
HIV: NONREACTIVE
Lymphocytes Relative: 11 % — ABNORMAL LOW (ref 12–46)
Lymphs Abs: 1.3 10*3/uL (ref 0.7–4.0)
MCH: 27.9 pg (ref 26.0–34.0)
MCHC: 33.1 g/dL (ref 30.0–36.0)
MCV: 84.4 fL (ref 78.0–100.0)
MPV: 8.6 fL (ref 8.6–12.4)
Monocytes Absolute: 0.6 10*3/uL (ref 0.1–1.0)
Monocytes Relative: 5 % (ref 3–12)
NEUTROS ABS: 9.7 10*3/uL — AB (ref 1.7–7.7)
NEUTROS PCT: 84 % — AB (ref 43–77)
Platelets: 395 10*3/uL (ref 150–400)
RBC: 4.69 MIL/uL (ref 3.87–5.11)
RDW: 14.6 % (ref 11.5–15.5)
Rh Type: POSITIVE
Rubella: 1.42 Index — ABNORMAL HIGH (ref <0.90)
WBC: 11.6 10*3/uL — AB (ref 4.0–10.5)

## 2014-11-25 LAB — CYTOLOGY - PAP

## 2014-11-26 LAB — PRESCRIPTION MONITORING PROFILE (19 PANEL)
Amphetamine/Meth: NEGATIVE ng/mL
BUPRENORPHINE, URINE: NEGATIVE ng/mL
Barbiturate Screen, Urine: NEGATIVE ng/mL
Benzodiazepine Screen, Urine: NEGATIVE ng/mL
CANNABINOID SCRN UR: NEGATIVE ng/mL
CREATININE, URINE: 249.48 mg/dL (ref >20.0)
Carisoprodol, Urine: NEGATIVE ng/mL
Cocaine Metabolites: NEGATIVE ng/mL
ECSTASY: NEGATIVE ng/mL
Fentanyl, Ur: NEGATIVE ng/mL
Meperidine, Ur: NEGATIVE ng/mL
Methadone Screen, Urine: NEGATIVE ng/mL
Methaqualone: NEGATIVE ng/mL
NITRITES URINE, INITIAL: NEGATIVE ug/mL
OPIATE SCREEN, URINE: NEGATIVE ng/mL
Oxycodone Screen, Ur: NEGATIVE ng/mL
PROPOXYPHENE: NEGATIVE ng/mL
Phencyclidine, Ur: NEGATIVE ng/mL
Tapentadol, urine: NEGATIVE ng/mL
Tramadol Scrn, Ur: NEGATIVE ng/mL
ZOLPIDEM, URINE: NEGATIVE ng/mL
pH, Initial: 7.1 pH (ref 4.5–8.9)

## 2014-11-26 LAB — HEMOGLOBINOPATHY EVALUATION
HEMOGLOBIN OTHER: 0 %
HGB S QUANTITAION: 0 %
Hgb A2 Quant: 2.4 % (ref 2.2–3.2)
Hgb A: 97.2 % (ref 96.8–97.8)
Hgb F Quant: 0.4 % (ref 0.0–2.0)

## 2014-11-27 LAB — CULTURE, OB URINE: Colony Count: 30000

## 2014-11-29 ENCOUNTER — Encounter: Payer: Self-pay | Admitting: Obstetrics & Gynecology

## 2014-11-29 ENCOUNTER — Telehealth: Payer: Self-pay

## 2014-11-29 DIAGNOSIS — R87610 Atypical squamous cells of undetermined significance on cytologic smear of cervix (ASC-US): Secondary | ICD-10-CM | POA: Insufficient documentation

## 2014-11-29 DIAGNOSIS — R87810 Cervical high risk human papillomavirus (HPV) DNA test positive: Secondary | ICD-10-CM

## 2014-11-29 NOTE — Telephone Encounter (Signed)
Called patient and informed her of results and need for colpo. Discussed HPV in great detail. Informed patient front office staff will call her with appointment-- though explained we may try to perform at next OB visit. Patient verbalized understanding and gratitude. No questions or concerns.

## 2014-11-29 NOTE — Telephone Encounter (Signed)
-----   Message from Tereso NewcomerUgonna A Anyanwu, MD sent at 11/29/2014  2:22 PM EST ----- Please schedule patient for colposcopy for ASCUS +HRHPV pap. Please call to inform patient of results and need for appointment.

## 2014-11-30 ENCOUNTER — Encounter: Payer: Self-pay | Admitting: *Deleted

## 2014-12-02 ENCOUNTER — Ambulatory Visit (HOSPITAL_COMMUNITY)
Admission: RE | Admit: 2014-12-02 | Discharge: 2014-12-02 | Disposition: A | Discharge status: Home / Self Care | Payer: BC Managed Care – PPO | Source: Ambulatory Visit | Attending: Obstetrics & Gynecology | Admitting: Obstetrics & Gynecology

## 2014-12-02 ENCOUNTER — Encounter: Payer: Self-pay | Admitting: Obstetrics & Gynecology

## 2014-12-02 ENCOUNTER — Encounter (HOSPITAL_COMMUNITY): Payer: Self-pay

## 2014-12-02 DIAGNOSIS — Z3A13 13 weeks gestation of pregnancy: Secondary | ICD-10-CM | POA: Diagnosis not present

## 2014-12-02 DIAGNOSIS — Z36 Encounter for antenatal screening of mother: Secondary | ICD-10-CM | POA: Insufficient documentation

## 2014-12-02 DIAGNOSIS — Z3491 Encounter for supervision of normal pregnancy, unspecified, first trimester: Secondary | ICD-10-CM

## 2014-12-02 DIAGNOSIS — Z369 Encounter for antenatal screening, unspecified: Secondary | ICD-10-CM | POA: Insufficient documentation

## 2014-12-03 ENCOUNTER — Encounter: Payer: Self-pay | Admitting: Obstetrics & Gynecology

## 2014-12-08 ENCOUNTER — Telehealth: Payer: Self-pay | Admitting: General Practice

## 2014-12-08 NOTE — Telephone Encounter (Signed)
Patient called and left message stating she was told last week she needs a colposcopy and wants to know why and what the procedure is since she is pregnant. Called patient stating I am returning your phone call and explained colposcopy to patient and why the procedure is done. Patient verbalized understanding and had no other questions

## 2014-12-14 ENCOUNTER — Other Ambulatory Visit (HOSPITAL_COMMUNITY): Payer: Self-pay | Admitting: Obstetrics & Gynecology

## 2014-12-22 ENCOUNTER — Encounter: Payer: BC Managed Care – PPO | Admitting: Family

## 2014-12-23 ENCOUNTER — Ambulatory Visit (INDEPENDENT_AMBULATORY_CARE_PROVIDER_SITE_OTHER): Payer: BC Managed Care – PPO | Admitting: Family Medicine

## 2014-12-23 VITALS — BP 116/73 | HR 116 | Wt 122.8 lb

## 2014-12-23 DIAGNOSIS — Z3492 Encounter for supervision of normal pregnancy, unspecified, second trimester: Secondary | ICD-10-CM

## 2014-12-23 DIAGNOSIS — R87810 Cervical high risk human papillomavirus (HPV) DNA test positive: Secondary | ICD-10-CM

## 2014-12-23 DIAGNOSIS — R87610 Atypical squamous cells of undetermined significance on cytologic smear of cervix (ASC-US): Secondary | ICD-10-CM

## 2014-12-23 LAB — POCT URINALYSIS DIP (DEVICE)
GLUCOSE, UA: NEGATIVE mg/dL
HGB URINE DIPSTICK: NEGATIVE
KETONES UR: NEGATIVE mg/dL
NITRITE: NEGATIVE
PH: 5.5 (ref 5.0–8.0)
Protein, ur: 30 mg/dL — AB
UROBILINOGEN UA: 1 mg/dL (ref 0.0–1.0)

## 2014-12-23 NOTE — Progress Notes (Signed)
Nutrition note: consult re: wt loss/ poor appetite Pt has lost 1.2# @ 16w. Pt reports she has a low appetite currently & also frequent N&V. Pt reports she was eating 2 meals & 3 snacks/d but recently due to some stress she is only eating 2x/d. Pt is taking a PNV. Pt reports heartburn as well as the N&V. Pt received verbal & written education on general nutrition during pregnancy. Discussed tips to decrease N&V and heartburn. Also provided handout on Small Frequent Meals to encourage pt to gradually increase intake. Discussed benefits & importance of BF. Discussed wt gain goals of 25-35# or 1#/wk. Pt agrees to try to increase intake & include protein with all meals & snacks. Pt does not have WIC but plans to apply. Pt is unsure about BF but may think about pumping. F/u in 4-6 wks Blondell RevealLaura Usha Slager, MS, RD, LDN, [facility]

## 2014-12-23 NOTE — Progress Notes (Signed)
Pt feels tired all the time. Decreased appetite

## 2014-12-23 NOTE — Progress Notes (Signed)
ASCUS with +HPV Patient given informed consent, signed copy in the chart, time out was performed.  Placed in lithotomy position. Cervix viewed with speculum and colposcope after application of acetic acid.   Colposcopy adequate?  SCJ seen 360 degrees Acetowhite lesions?no Punctation?no Mosaicism?  no Abnormal vasculature?  no Biopsies?no ECC?no  Having social problems with FOB.  Will have pt see social work.  AFP done today.  20wk US.

## 2014-12-23 NOTE — Progress Notes (Signed)
Anatomy U/S 01/20/15 @ 830a with Radiology.

## 2014-12-23 NOTE — Patient Instructions (Signed)
Second Trimester of Pregnancy The second trimester is from week 13 through week 28, month 4 through 6. This is often the time in pregnancy that you feel your best. Often times, morning sickness has lessened or quit. You may have more energy, and you may get hungry more often. Your unborn baby (fetus) is growing rapidly. At the end of the sixth month, he or she is about 9 inches long and weighs about 1 pounds. You will likely feel the baby move (quickening) between 18 and 20 weeks of pregnancy. HOME CARE   Avoid all smoking, herbs, and alcohol. Avoid drugs not approved by your doctor.  Only take medicine as told by your doctor. Some medicines are safe and some are not during pregnancy.  Exercise only as told by your doctor. Stop exercising if you start having cramps.  Eat regular, healthy meals.  Wear a good support bra if your breasts are tender.  Do not use hot tubs, steam rooms, or saunas.  Wear your seat belt when driving.  Avoid raw meat, uncooked cheese, and liter boxes and soil used by cats.  Take your prenatal vitamins.  Try taking medicine that helps you poop (stool softener) as needed, and if your doctor approves. Eat more fiber by eating fresh fruit, vegetables, and whole grains. Drink enough fluids to keep your pee (urine) clear or pale yellow.  Take warm water baths (sitz baths) to soothe pain or discomfort caused by hemorrhoids. Use hemorrhoid cream if your doctor approves.  If you have puffy, bulging veins (varicose veins), wear support hose. Raise (elevate) your feet for 15 minutes, 3-4 times a day. Limit salt in your diet.  Avoid heavy lifting, wear low heals, and sit up straight.  Rest with your legs raised if you have leg cramps or low back pain.  Visit your dentist if you have not gone during your pregnancy. Use a soft toothbrush to brush your teeth. Be gentle when you floss.  You can have sex (intercourse) unless your doctor tells you not to.  Go to your  doctor visits. GET HELP IF:   You feel dizzy.  You have mild cramps or pressure in your lower belly (abdomen).  You have a nagging pain in your belly area.  You continue to feel sick to your stomach (nauseous), throw up (vomit), or have watery poop (diarrhea).  You have bad smelling fluid coming from your vagina.  You have pain with peeing (urination). GET HELP RIGHT AWAY IF:   You have a fever.  You are leaking fluid from your vagina.  You have spotting or bleeding from your vagina.  You have severe belly cramping or pain.  You lose or gain weight rapidly.  You have trouble catching your breath and have chest pain.  You notice sudden or extreme puffiness (swelling) of your face, hands, ankles, feet, or legs.  You have not felt the baby move in over an hour.  You have severe headaches that do not go away with medicine.  You have vision changes. Document Released: 12/19/2009 Document Revised: 01/19/2013 Document Reviewed: 11/25/2012 [employer] Patient Information 2015 [employer]. This information is not intended to replace advice given to you by your health care provider. Make sure you discuss any questions you have with your health care provider.  

## 2014-12-27 LAB — ALPHA FETOPROTEIN, MATERNAL
AFP: 47.4 ng/mL
Curr Gest Age: 16 wks.days
MOM FOR AFP: 1.11
OPEN SPINA BIFIDA: NEGATIVE
Osb Risk: 1:18100 {titer}

## 2014-12-29 ENCOUNTER — Inpatient Hospital Stay (HOSPITAL_COMMUNITY): Payer: BLUE CROSS/BLUE SHIELD

## 2014-12-29 ENCOUNTER — Encounter (HOSPITAL_COMMUNITY): Payer: Self-pay | Admitting: *Deleted

## 2014-12-29 ENCOUNTER — Inpatient Hospital Stay (HOSPITAL_COMMUNITY)
Admission: AD | Admit: 2014-12-29 | Discharge: 2014-12-29 | Disposition: A | Discharge status: Home / Self Care | Payer: BLUE CROSS/BLUE SHIELD | Source: Ambulatory Visit | Attending: Obstetrics & Gynecology | Admitting: Obstetrics & Gynecology

## 2014-12-29 DIAGNOSIS — O26899 Other specified pregnancy related conditions, unspecified trimester: Secondary | ICD-10-CM

## 2014-12-29 DIAGNOSIS — O9989 Other specified diseases and conditions complicating pregnancy, childbirth and the puerperium: Secondary | ICD-10-CM | POA: Insufficient documentation

## 2014-12-29 DIAGNOSIS — Z3A16 16 weeks gestation of pregnancy: Secondary | ICD-10-CM | POA: Insufficient documentation

## 2014-12-29 DIAGNOSIS — R109 Unspecified abdominal pain: Secondary | ICD-10-CM | POA: Diagnosis present

## 2014-12-29 LAB — URINALYSIS, ROUTINE W REFLEX MICROSCOPIC
BILIRUBIN URINE: NEGATIVE
Glucose, UA: NEGATIVE mg/dL
Hgb urine dipstick: NEGATIVE
KETONES UR: NEGATIVE mg/dL
NITRITE: NEGATIVE
Protein, ur: NEGATIVE mg/dL
Specific Gravity, Urine: 1.01 (ref 1.005–1.030)
UROBILINOGEN UA: 0.2 mg/dL (ref 0.0–1.0)
pH: 7 (ref 5.0–8.0)

## 2014-12-29 LAB — WET PREP, GENITAL
CLUE CELLS WET PREP: NONE SEEN
Trich, Wet Prep: NONE SEEN
Yeast Wet Prep HPF POC: NONE SEEN

## 2014-12-29 LAB — CBC
HCT: 35.5 % — ABNORMAL LOW (ref 36.0–46.0)
Hemoglobin: 11.9 g/dL — ABNORMAL LOW (ref 12.0–15.0)
MCH: 28.1 pg (ref 26.0–34.0)
MCHC: 33.5 g/dL (ref 30.0–36.0)
MCV: 83.9 fL (ref 78.0–100.0)
PLATELETS: 320 10*3/uL (ref 150–400)
RBC: 4.23 MIL/uL (ref 3.87–5.11)
RDW: 13.2 % (ref 11.5–15.5)
WBC: 11 10*3/uL — ABNORMAL HIGH (ref 4.0–10.5)

## 2014-12-29 LAB — URINE MICROSCOPIC-ADD ON

## 2014-12-29 LAB — OB RESULTS CONSOLE GC/CHLAMYDIA: GC PROBE AMP, GENITAL: NEGATIVE

## 2014-12-29 NOTE — MAU Provider Note (Signed)
Chief Complaint: Abdominal Pain   None     SUBJECTIVE HPI: Olivia Cooper is a 24 y.o. G1P0 at [redacted]w[redacted]d by LMP who presents to maternity admissions reporting left mid abdominal pain starting 1 week ago, described as throbbing and intermittent. She has not taken any medication or tried any comfort measures for pain.  She reports the pain increased yesterday keeping her from sleeping.  It is not affected by her movement and occurs at random.   She denies vaginal bleeding, vaginal itching/burning, urinary symptoms, h/a, dizziness, n/v, or fever/chills.    Past Medical History  Diagnosis Date  . Medical history non-contributory    Past Surgical History  Procedure Laterality Date  . No past surgeries     History   Social History  . Marital Status: Single    Spouse Name: N/A  . Number of Children: N/A  . Years of Education: N/A   Occupational History  . Not on file.   Social History Main Topics  . Smoking status: Never Smoker   . Smokeless tobacco: Not on file  . Alcohol Use: No  . Drug Use: No  . Sexual Activity: Not on file   Other Topics Concern  . Not on file   Social History Narrative   No current facility-administered medications on file prior to encounter.   Current Outpatient Prescriptions on File Prior to Encounter  Medication Sig Dispense Refill  . Prenatal Vit-Fe Fumarate-FA (PRENATAL COMPLETE) 14-0.4 MG TABS Take 1 tablet by mouth daily. 60 each 1   No Known Allergies  ROS: Pertinent items in HPI  OBJECTIVE Blood pressure 108/63, pulse 93, temperature 98.2 F (36.8 C), temperature source Oral, resp. rate 16, height  (1.626 m), weight 56.246 kg (124 lb), last menstrual period 08/08/2014. GENERAL: Well-developed, well-nourished female in no acute distress.  HEENT: Normocephalic HEART: normal rate RESP: normal effort ABDOMEN: Soft, mild tenderness of left abdomen, at level of umbilicus, no rebound tenderness or guarding EXTREMITIES: Nontender, no  edema NEURO: Alert and oriented Pelvic exam: Cervix pink, visually closed, without lesion, scant white creamy discharge, vaginal walls and external genitalia normal Cervix 0/thick/posterior  LAB RESULTS Results for orders placed or performed during the hospital encounter of 12/29/14 (from the past 24 hour(s))  Urinalysis, Routine w reflex microscopic     Status: Abnormal   Collection Time: 12/29/14  2:15 PM  Result Value Ref Range   Color, Urine YELLOW YELLOW   APPearance CLOUDY (A) CLEAR   Specific Gravity, Urine 1.010 1.005 - 1.030   pH 7.0 5.0 - 8.0   Glucose, UA NEGATIVE NEGATIVE mg/dL   Hgb urine dipstick NEGATIVE NEGATIVE   Bilirubin Urine NEGATIVE NEGATIVE   Ketones, ur NEGATIVE NEGATIVE mg/dL   Protein, ur NEGATIVE NEGATIVE mg/dL   Urobilinogen, UA 0.2 0.0 - 1.0 mg/dL   Nitrite NEGATIVE NEGATIVE   Leukocytes, UA TRACE (A) NEGATIVE  Urine microscopic-add on     Status: Abnormal   Collection Time: 12/29/14  2:15 PM  Result Value Ref Range   Squamous Epithelial / LPF MANY (A) RARE   WBC, UA 0-2 <3 WBC/hpf   Bacteria, UA FEW (A) RARE    ASSESSMENT 1. Abdominal pain affecting pregnancy     PLAN Pt left AMA prior to ultrasound     Medication List    ASK your doctor about these medications        ferrous sulfate 325 (65 FE) MG tablet  Take 325 mg by mouth daily with breakfast.  PRENATAL COMPLETE 14-0.4 MG Tabs  Take 1 tablet by mouth daily.         Sharen CounterLisa Leftwich-Kirby Certified Nurse-Midwife 12/29/2014  5:30 PM

## 2014-12-29 NOTE — MAU Note (Signed)
Having pain in lower abd and on side. Started last wk.  Throbbing pains on side.

## 2014-12-29 NOTE — MAU Note (Signed)
C/o L lower abdominal throbbing pain for past week; no bleeding but c/o some vaginal discharge; FHR doppered @ 154;

## 2014-12-30 LAB — HIV ANTIBODY (ROUTINE TESTING W REFLEX): HIV Screen 4th Generation wRfx: NONREACTIVE

## 2014-12-30 LAB — GC/CHLAMYDIA PROBE AMP (~~LOC~~) NOT AT ARMC
Chlamydia: NEGATIVE
NEISSERIA GONORRHEA: NEGATIVE

## 2015-01-03 ENCOUNTER — Encounter: Payer: Self-pay | Admitting: *Deleted

## 2015-01-20 ENCOUNTER — Ambulatory Visit (INDEPENDENT_AMBULATORY_CARE_PROVIDER_SITE_OTHER): Payer: BLUE CROSS/BLUE SHIELD | Admitting: Family Medicine

## 2015-01-20 ENCOUNTER — Encounter: Payer: Self-pay | Admitting: Family Medicine

## 2015-01-20 ENCOUNTER — Ambulatory Visit (HOSPITAL_COMMUNITY)
Admission: RE | Admit: 2015-01-20 | Discharge: 2015-01-20 | Disposition: A | Discharge status: Home / Self Care | Payer: BLUE CROSS/BLUE SHIELD | Source: Ambulatory Visit | Attending: Family Medicine | Admitting: Family Medicine

## 2015-01-20 VITALS — BP 118/78 | HR 115 | Temp 98.4°F | Wt 130.2 lb

## 2015-01-20 DIAGNOSIS — Z3689 Encounter for other specified antenatal screening: Secondary | ICD-10-CM | POA: Insufficient documentation

## 2015-01-20 DIAGNOSIS — Z3492 Encounter for supervision of normal pregnancy, unspecified, second trimester: Secondary | ICD-10-CM

## 2015-01-20 DIAGNOSIS — IMO0002 Reserved for concepts with insufficient information to code with codable children: Secondary | ICD-10-CM | POA: Insufficient documentation

## 2015-01-20 DIAGNOSIS — Z3A20 20 weeks gestation of pregnancy: Secondary | ICD-10-CM | POA: Insufficient documentation

## 2015-01-20 LAB — POCT URINALYSIS DIP (DEVICE)
Bilirubin Urine: NEGATIVE
GLUCOSE, UA: NEGATIVE mg/dL
Hgb urine dipstick: NEGATIVE
KETONES UR: NEGATIVE mg/dL
Leukocytes, UA: NEGATIVE
NITRITE: NEGATIVE
Protein, ur: NEGATIVE mg/dL
Specific Gravity, Urine: 1.01 (ref 1.005–1.030)
Urobilinogen, UA: 0.2 mg/dL (ref 0.0–1.0)
pH: 7 (ref 5.0–8.0)

## 2015-01-20 NOTE — Progress Notes (Signed)
No concerns.  Intermittent pressure in RLQ after urinates.  No pain on exam.  No round ligament pain.  [facility]

## 2015-01-20 NOTE — Progress Notes (Signed)
Pt complains of pressure in RLU when urinating

## 2015-01-20 NOTE — Patient Instructions (Signed)
Second Trimester of Pregnancy The second trimester is from week 13 through week 28, months 4 through 6. The second trimester is often a time when you feel your best. Your body has also adjusted to being pregnant, and you begin to feel better physically. Usually, morning sickness has lessened or quit completely, you may have more energy, and you may have an increase in appetite. The second trimester is also a time when the fetus is growing rapidly. At the end of the sixth month, the fetus is about 9 inches long and weighs about 1 pounds. You will likely begin to feel the baby move (quickening) between 18 and 20 weeks of the pregnancy. BODY CHANGES Your body goes through many changes during pregnancy. The changes vary from woman to woman.   Your weight will continue to increase. You will notice your lower abdomen bulging out.  You may begin to get stretch marks on your hips, abdomen, and breasts.  You may develop headaches that can be relieved by medicines approved by your health care provider.  You may urinate more often because the fetus is pressing on your bladder.  You may develop or continue to have heartburn as a result of your pregnancy.  You may develop constipation because certain hormones are causing the muscles that push waste through your intestines to slow down.  You may develop hemorrhoids or swollen, bulging veins (varicose veins).  You may have back pain because of the weight gain and pregnancy hormones relaxing your joints between the bones in your pelvis and as a result of a shift in weight and the muscles that support your balance.  Your breasts will continue to grow and be tender.  Your gums may bleed and may be sensitive to brushing and flossing.  Dark spots or blotches (chloasma, mask of pregnancy) may develop on your face. This will likely fade after the baby is born.  A dark line from your belly button to the pubic area (linea nigra) may appear. This will likely fade  after the baby is born.  You may have changes in your hair. These can include thickening of your hair, rapid growth, and changes in texture. Some women also have hair loss during or after pregnancy, or hair that feels dry or thin. Your hair will most likely return to normal after your baby is born. WHAT TO EXPECT AT YOUR PRENATAL VISITS During a routine prenatal visit:  You will be weighed to make sure you and the fetus are growing normally.  Your blood pressure will be taken.  Your abdomen will be measured to track your baby's growth.  The fetal heartbeat will be listened to.  Any test results from the previous visit will be discussed. Your health care provider may ask you:  How you are feeling.  If you are feeling the baby move.  If you have had any abnormal symptoms, such as leaking fluid, bleeding, severe headaches, or abdominal cramping.  If you have any questions. Other tests that may be performed during your second trimester include:  Blood tests that check for:  Low iron levels (anemia).  Gestational diabetes (between 24 and 28 weeks).  Rh antibodies.  Urine tests to check for infections, diabetes, or protein in the urine.  An ultrasound to confirm the proper growth and development of the baby.  An amniocentesis to check for possible genetic problems.  Fetal screens for spina bifida and Down syndrome. HOME CARE INSTRUCTIONS   Avoid all smoking, herbs, alcohol, and unprescribed   drugs. These chemicals affect the formation and growth of the baby.  Follow your health care provider's instructions regarding medicine use. There are medicines that are either safe or unsafe to take during pregnancy.  Exercise only as directed by your health care provider. Experiencing uterine cramps is a good sign to stop exercising.  Continue to eat regular, healthy meals.  Wear a good support bra for breast tenderness.  Do not use hot tubs, steam rooms, or saunas.  Wear your  seat belt at all times when driving.  Avoid raw meat, uncooked cheese, cat litter boxes, and soil used by cats. These carry germs that can cause birth defects in the baby.  Take your prenatal vitamins.  Try taking a stool softener (if your health care provider approves) if you develop constipation. Eat more high-fiber foods, such as fresh vegetables or fruit and whole grains. Drink plenty of fluids to keep your urine clear or pale yellow.  Take warm sitz baths to soothe any pain or discomfort caused by hemorrhoids. Use hemorrhoid cream if your health care provider approves.  If you develop varicose veins, wear support hose. Elevate your feet for 15 minutes, 3-4 times a day. Limit salt in your diet.  Avoid heavy lifting, wear low heel shoes, and practice good posture.  Rest with your legs elevated if you have leg cramps or low back pain.  Visit your dentist if you have not gone yet during your pregnancy. Use a soft toothbrush to brush your teeth and be gentle when you floss.  A sexual relationship may be continued unless your health care provider directs you otherwise.  Continue to go to all your prenatal visits as directed by your health care provider. SEEK MEDICAL CARE IF:   You have dizziness.  You have mild pelvic cramps, pelvic pressure, or nagging pain in the abdominal area.  You have persistent nausea, vomiting, or diarrhea.  You have a bad smelling vaginal discharge.  You have pain with urination. SEEK IMMEDIATE MEDICAL CARE IF:   You have a fever.  You are leaking fluid from your vagina.  You have spotting or bleeding from your vagina.  You have severe abdominal cramping or pain.  You have rapid weight gain or loss.  You have shortness of breath with chest pain.  You notice sudden or extreme swelling of your face, hands, ankles, feet, or legs.  You have not felt your baby move in over an hour.  You have severe headaches that do not go away with  medicine.  You have vision changes. Document Released: 09/18/2001 Document Revised: 09/29/2013 Document Reviewed: 11/25/2012 [employer] Patient Information 2015 [employer]. This information is not intended to replace advice given to you by your health care provider. Make sure you discuss any questions you have with your health care provider.  

## 2015-02-16 ENCOUNTER — Encounter: Payer: BLUE CROSS/BLUE SHIELD | Admitting: Advanced Practice Midwife

## 2015-03-02 ENCOUNTER — Encounter: Payer: Self-pay | Admitting: Obstetrics and Gynecology

## 2015-03-02 ENCOUNTER — Ambulatory Visit (INDEPENDENT_AMBULATORY_CARE_PROVIDER_SITE_OTHER): Payer: BLUE CROSS/BLUE SHIELD | Admitting: Obstetrics and Gynecology

## 2015-03-02 VITALS — BP 105/58 | HR 85 | Wt 133.8 lb

## 2015-03-02 DIAGNOSIS — Z23 Encounter for immunization: Secondary | ICD-10-CM

## 2015-03-02 DIAGNOSIS — Z3492 Encounter for supervision of normal pregnancy, unspecified, second trimester: Secondary | ICD-10-CM

## 2015-03-02 LAB — POCT URINALYSIS DIP (DEVICE)
Bilirubin Urine: NEGATIVE
GLUCOSE, UA: NEGATIVE mg/dL
HGB URINE DIPSTICK: NEGATIVE
Ketones, ur: NEGATIVE mg/dL
NITRITE: NEGATIVE
Protein, ur: NEGATIVE mg/dL
SPECIFIC GRAVITY, URINE: 1.025 (ref 1.005–1.030)
Urobilinogen, UA: 0.2 mg/dL (ref 0.0–1.0)
pH: 7 (ref 5.0–8.0)

## 2015-03-02 NOTE — Progress Notes (Signed)
C/o still having lower back pain.Declines flu shot.

## 2015-03-02 NOTE — Patient Instructions (Addendum)
Second Trimester of Pregnancy The second trimester is from week 13 through week 28, months 4 through 6. The second trimester is often a time when you feel your best. Your body has also adjusted to being pregnant, and you begin to feel better physically. Usually, morning sickness has lessened or quit completely, you may have more energy, and you may have an increase in appetite. The second trimester is also a time when the fetus is growing rapidly. At the end of the sixth month, the fetus is about 9 inches long and weighs about 1 pounds. You will likely begin to feel the baby move (quickening) between 18 and 20 weeks of the pregnancy. BODY CHANGES Your body goes through many changes during pregnancy. The changes vary from woman to woman.   Your weight will continue to increase. You will notice your lower abdomen bulging out.  You may begin to get stretch marks on your hips, abdomen, and breasts.  You may develop headaches that can be relieved by medicines approved by your health care provider.  You may urinate more often because the fetus is pressing on your bladder.  You may develop or continue to have heartburn as a result of your pregnancy.  You may develop constipation because certain hormones are causing the muscles that push waste through your intestines to slow down.  You may develop hemorrhoids or swollen, bulging veins (varicose veins).  You may have back pain because of the weight gain and pregnancy hormones relaxing your joints between the bones in your pelvis and as a result of a shift in weight and the muscles that support your balance.  Your breasts will continue to grow and be tender.  Your gums may bleed and may be sensitive to brushing and flossing.  Dark spots or blotches (chloasma, mask of pregnancy) may develop on your face. This will likely fade after the baby is born.  A dark line from your belly button to the pubic area (linea nigra) may appear. This will likely fade  after the baby is born.  You may have changes in your hair. These can include thickening of your hair, rapid growth, and changes in texture. Some women also have hair loss during or after pregnancy, or hair that feels dry or thin. Your hair will most likely return to normal after your baby is born. WHAT TO EXPECT AT YOUR PRENATAL VISITS During a routine prenatal visit:  You will be weighed to make sure you and the fetus are growing normally.  Your blood pressure will be taken.  Your abdomen will be measured to track your baby's growth.  The fetal heartbeat will be listened to.  Any test results from the previous visit will be discussed. Your health care provider may ask you:  How you are feeling.  If you are feeling the baby move.  If you have had any abnormal symptoms, such as leaking fluid, bleeding, severe headaches, or abdominal cramping.  If you have any questions. Other tests that may be performed during your second trimester include:  Blood tests that check for:  Low iron levels (anemia).  Gestational diabetes (between 24 and 28 weeks).  Rh antibodies.  Urine tests to check for infections, diabetes, or protein in the urine.  An ultrasound to confirm the proper growth and development of the baby.  An amniocentesis to check for possible genetic problems.  Fetal screens for spina bifida and Down syndrome. HOME CARE INSTRUCTIONS   Avoid all smoking, herbs, alcohol, and unprescribed   drugs. These chemicals affect the formation and growth of the baby.  Follow your health care provider's instructions regarding medicine use. There are medicines that are either safe or unsafe to take during pregnancy.  Exercise only as directed by your health care provider. Experiencing uterine cramps is a good sign to stop exercising.  Continue to eat regular, healthy meals.  Wear a good support bra for breast tenderness.  Do not use hot tubs, steam rooms, or saunas.  Wear your  seat belt at all times when driving.  Avoid raw meat, uncooked cheese, cat litter boxes, and soil used by cats. These carry germs that can cause birth defects in the baby.  Take your prenatal vitamins.  Try taking a stool softener (if your health care provider approves) if you develop constipation. Eat more high-fiber foods, such as fresh vegetables or fruit and whole grains. Drink plenty of fluids to keep your urine clear or pale yellow.  Take warm sitz baths to soothe any pain or discomfort caused by hemorrhoids. Use hemorrhoid cream if your health care provider approves.  If you develop varicose veins, wear support hose. Elevate your feet for 15 minutes, 3-4 times a day. Limit salt in your diet.  Avoid heavy lifting, wear low heel shoes, and practice good posture.  Rest with your legs elevated if you have leg cramps or low back pain.  Visit your dentist if you have not gone yet during your pregnancy. Use a soft toothbrush to brush your teeth and be gentle when you floss.  A sexual relationship may be continued unless your health care provider directs you otherwise.  Continue to go to all your prenatal visits as directed by your health care provider. SEEK MEDICAL CARE IF:   You have dizziness.  You have mild pelvic cramps, pelvic pressure, or nagging pain in the abdominal area.  You have persistent nausea, vomiting, or diarrhea.  You have a bad smelling vaginal discharge.  You have pain with urination. SEEK IMMEDIATE MEDICAL CARE IF:   You have a fever.  You are leaking fluid from your vagina.  You have spotting or bleeding from your vagina.  You have severe abdominal cramping or pain.  You have rapid weight gain or loss.  You have shortness of breath with chest pain.  You notice sudden or extreme swelling of your face, hands, ankles, feet, or legs.  You have not felt your baby move in over an hour.  You have severe headaches that do not go away with  medicine.  You have vision changes. Document Released: 09/18/2001 Document Revised: 09/29/2013 Document Reviewed: 11/25/2012 [facility] Patient Information 2015 [place], [place]. This information is not intended to replace advice given to you by your health care provider. Make sure you discuss any questions you have with your health care provider. Back Pain in Pregnancy Back pain during pregnancy is common. It happens in about half of all pregnancies. It is important for you and your baby that you remain active during your pregnancy.If you feel that back pain is not allowing you to remain active or sleep well, it is time to see your caregiver. Back pain may be caused by several factors related to changes during your pregnancy.Fortunately, unless you had trouble with your back before your pregnancy, the pain is likely to get better after you deliver. Low back pain usually occurs between the fifth and seventh months of pregnancy. It can, however, happen in the first couple months. Factors that increase the risk of back  problems include:   Previous back problems.  Injury to your back.  Having twins or multiple births.  A chronic cough.  Stress.  Job-related repetitive motions.  Muscle or spinal disease in the back.  Family history of back problems, ruptured (herniated) discs, or osteoporosis.  Depression, anxiety, and panic attacks. CAUSES   When you are pregnant, your body produces a hormone called relaxin. This hormonemakes the ligaments connecting the low back and pubic bones more flexible. This flexibility allows the baby to be delivered more easily. When your ligaments are loose, your muscles need to work harder to support your back. Soreness in your back can come from tired muscles. Soreness can also come from back tissues that are irritated since they are receiving less support.  As the baby grows, it puts pressure on the nerves and blood vessels in your pelvis. This can cause back  pain.  As the baby grows and gets heavier during pregnancy, the uterus pushes the stomach muscles forward and changes your center of gravity. This makes your back muscles work harder to maintain good posture. SYMPTOMS  Lumbar pain during pregnancy Lumbar pain during pregnancy usually occurs at or above the waist in the center of the back. There may be pain and numbness that radiates into your leg or foot. This is similar to low back pain experienced by non-pregnant women. It usually increases with sitting for long periods of time, standing, or repetitive lifting. Tenderness may also be present in the muscles along your upper back. Posterior pelvic pain during pregnancy Pain in the back of the pelvis is more common than lumbar pain in pregnancy. It is a deep pain felt in your side at the waistline, or across the tailbone (sacrum), or in both places. You may have pain on one or both sides. This pain can also go into the buttocks and backs of the upper thighs. Pubic and groin pain may also be present. The pain does not quickly resolve with rest, and morning stiffness may also be present. Pelvic pain during pregnancy can be brought on by most activities. A high level of fitness before and during pregnancy may or may not prevent this problem. Labor pain is usually 1 to 2 minutes apart, lasts for about 1 minute, and involves a bearing down feeling or pressure in your pelvis. However, if you are at term with the pregnancy, constant low back pain can be the beginning of early labor, and you should be aware of this. DIAGNOSIS  X-rays of the back should not be done during the first 12 to 14 weeks of the pregnancy and only when absolutely necessary during the rest of the pregnancy. MRIs do not give off radiation and are safe during pregnancy. MRIs also should only be done when absolutely necessary. HOME CARE INSTRUCTIONS  Exercise as directed by your caregiver. Exercise is the most effective way to prevent or  manage back pain. If you have a back problem, it is especially important to avoid sports that require sudden body movements. Swimming and walking are great activities.  Do not stand in one place for long periods of time.  Do not wear high heels.  Sit in chairs with good posture. Use a pillow on your lower back if necessary. Make sure your head rests over your shoulders and is not hanging forward.  Try sleeping on your side, preferably the left side, with a pillow or two between your legs. If you are sore after a night's rest, your bedmay betoo  soft.Try placing a board between your mattress and box spring.  Listen to your body when lifting.If you are experiencing pain, ask for help or try bending yourknees more so you can use your leg muscles rather than your back muscles. Squat down when picking up something from the floor. Do not bend over.  Eat a healthy diet. Try to gain weight within your caregiver's recommendations.  Use heat or cold packs 3 to 4 times a day for 15 minutes to help with the pain.  Only take over-the-counter or prescription medicines for pain, discomfort, or fever as directed by your caregiver. Sudden (acute) back pain  Use bed rest for only the most extreme, acute episodes of back pain. Prolonged bed rest over 48 hours will aggravate your condition.  Ice is very effective for acute conditions.  Put ice in a plastic bag.  Place a towel between your skin and the bag.  Leave the ice on for 10 to 20 minutes every 2 hours, or as needed.  Using heat packs for 30 minutes prior to activities is also helpful. Continued back pain See your caregiver if you have continued problems. Your caregiver can help or refer you for appropriate physical therapy. With conditioning, most back problems can be avoided. Sometimes, a more serious issue may be the cause of back pain. You should be seen right away if new problems seem to be developing. Your caregiver may recommend:  A  maternity girdle.  An elastic sling.  A back brace.  A massage therapist or acupuncture. SEEK MEDICAL CARE IF:   You are not able to do most of your daily activities, even when taking the pain medicine you were given.  You need a referral to a physical therapist or chiropractor.  You want to try acupuncture. SEEK IMMEDIATE MEDICAL CARE IF:  You develop numbness, tingling, weakness, or problems with the use of your arms or legs.  You develop severe back pain that is no longer relieved with medicines.  You have a sudden change in bowel or bladder control.  You have increasing pain in other areas of the body.  You develop shortness of breath, dizziness, or fainting.  You develop nausea, vomiting, or sweating.  You have back pain which is similar to labor pains.  You have back pain along with your water breaking or vaginal bleeding.  You have back pain or numbness that travels down your leg.  Your back pain developed after you fell.  You develop pain on one side of your back. You may have a kidney stone.  You see blood in your urine. You may have a bladder infection or kidney stone.  You have back pain with blisters. You may have shingles. Back pain is fairly common during pregnancy but should not be accepted as just part of the process. Back pain should always be treated as soon as possible. This will make your pregnancy as pleasant as possible. Document Released: 01/02/2006 Document Revised: 12/17/2011 Document Reviewed: 02/13/2011 [facility] Patient Information 2015 [place], [place]. This information is not intended to replace advice given to you by your health care provider. Make sure you discuss any questions you have with your health care provider. Contraception Choices Contraception (birth control) is the use of any methods or devices to prevent pregnancy. Below are some methods to help avoid pregnancy. HORMONAL METHODS   Contraceptive implant. This is a thin, plastic  tube containing progesterone hormone. It does not contain estrogen hormone. Your health care provider inserts the tube  in the inner part of the upper arm. The tube can remain in place for up to 3 years. After 3 years, the implant must be removed. The implant prevents the ovaries from releasing an egg (ovulation), thickens the cervical mucus to prevent sperm from entering the uterus, and thins the lining of the inside of the uterus.  Progesterone-only injections. These injections are given every 3 months by your health care provider to prevent pregnancy. This synthetic progesterone hormone stops the ovaries from releasing eggs. It also thickens cervical mucus and changes the uterine lining. This makes it harder for sperm to survive in the uterus.  Birth control pills. These pills contain estrogen and progesterone hormone. They work by preventing the ovaries from releasing eggs (ovulation). They also cause the cervical mucus to thicken, preventing the sperm from entering the uterus. Birth control pills are prescribed by a health care provider.Birth control pills can also be used to treat heavy periods.  Minipill. This type of birth control pill contains only the progesterone hormone. They are taken every day of each month and must be prescribed by your health care provider.  Birth control patch. The patch contains hormones similar to those in birth control pills. It must be changed once a week and is prescribed by a health care provider.  Vaginal ring. The ring contains hormones similar to those in birth control pills. It is left in the vagina for 3 weeks, removed for 1 week, and then a new one is put back in place. The patient must be comfortable inserting and removing the ring from the vagina.A health care provider's prescription is necessary.  Emergency contraception. Emergency contraceptives prevent pregnancy after unprotected sexual intercourse. This pill can be taken right after sex or up to 5 days  after unprotected sex. It is most effective the sooner you take the pills after having sexual intercourse. Most emergency contraceptive pills are available without a prescription. Check with your pharmacist. Do not use emergency contraception as your only form of birth control. BARRIER METHODS   Female condom. This is a thin sheath (latex or rubber) that is worn over the penis during sexual intercourse. It can be used with spermicide to increase effectiveness.  Female condom. This is a soft, loose-fitting sheath that is put into the vagina before sexual intercourse.  Diaphragm. This is a soft, latex, dome-shaped barrier that must be fitted by a health care provider. It is inserted into the vagina, along with a spermicidal jelly. It is inserted before intercourse. The diaphragm should be left in the vagina for 6 to 8 hours after intercourse.  Cervical cap. This is a round, soft, latex or plastic cup that fits over the cervix and must be fitted by a health care provider. The cap can be left in place for up to 48 hours after intercourse.  Sponge. This is a soft, circular piece of polyurethane foam. The sponge has spermicide in it. It is inserted into the vagina after wetting it and before sexual intercourse.  Spermicides. These are chemicals that kill or block sperm from entering the cervix and uterus. They come in the form of creams, jellies, suppositories, foam, or tablets. They do not require a prescription. They are inserted into the vagina with an applicator before having sexual intercourse. The process must be repeated every time you have sexual intercourse. INTRAUTERINE CONTRACEPTION  Intrauterine device (IUD). This is a T-shaped device that is put in a woman's uterus during a menstrual period to prevent pregnancy. There are  2 types:  Copper IUD. This type of IUD is wrapped in copper wire and is placed inside the uterus. Copper makes the uterus and fallopian tubes produce a fluid that kills  sperm. It can stay in place for 10 years.  Hormone IUD. This type of IUD contains the hormone progestin (synthetic progesterone). The hormone thickens the cervical mucus and prevents sperm from entering the uterus, and it also thins the uterine lining to prevent implantation of a fertilized egg. The hormone can weaken or kill the sperm that get into the uterus. It can stay in place for 3-5 years, depending on which type of IUD is used. PERMANENT METHODS OF CONTRACEPTION  Female tubal ligation. This is when the woman's fallopian tubes are surgically sealed, tied, or blocked to prevent the egg from traveling to the uterus.  Hysteroscopic sterilization. This involves placing a small coil or insert into each fallopian tube. Your doctor uses a technique called hysteroscopy to do the procedure. The device causes scar tissue to form. This results in permanent blockage of the fallopian tubes, so the sperm cannot fertilize the egg. It takes about 3 months after the procedure for the tubes to become blocked. You must use another form of birth control for these 3 months.  Female sterilization. This is when the female has the tubes that carry sperm tied off (vasectomy).This blocks sperm from entering the vagina during sexual intercourse. After the procedure, the man can still ejaculate fluid (semen). NATURAL PLANNING METHODS  Natural family planning. This is not having sexual intercourse or using a barrier method (condom, diaphragm, cervical cap) on days the woman could become pregnant.  Calendar method. This is keeping track of the length of each menstrual cycle and identifying when you are fertile.  Ovulation method. This is avoiding sexual intercourse during ovulation.  Symptothermal method. This is avoiding sexual intercourse during ovulation, using a thermometer and ovulation symptoms.  Post-ovulation method. This is timing sexual intercourse after you have ovulated. Regardless of which type or method of  contraception you choose, it is important that you use condoms to protect against the transmission of sexually transmitted infections (STIs). Talk with your health care provider about which form of contraception is most appropriate for you. Document Released: 09/24/2005 Document Revised: 09/29/2013 Document Reviewed: 03/19/2013 [facility] Patient Information 2015 [place], [place]. This information is not intended to replace advice given to you by your health care provider. Make sure you discuss any questions you have with your health care provider.

## 2015-03-02 NOTE — Progress Notes (Signed)
Doing well. [employer] sitting for long periods. LBP persisting, no radiation. Try position changes, abdominal tightening, back precautions. Neg CVAT or paraspinous TTP.  PTL and FM precautions, LARC discussed. Good diet, weight gain discussed. Labs next. Offer classes.

## 2015-03-09 ENCOUNTER — Encounter: Payer: Self-pay | Admitting: Obstetrics and Gynecology

## 2015-03-18 ENCOUNTER — Inpatient Hospital Stay (HOSPITAL_COMMUNITY)
Admission: AD | Admit: 2015-03-18 | Discharge: 2015-03-18 | Disposition: A | Discharge status: Home / Self Care | Payer: Medicaid Other | Source: Ambulatory Visit | Attending: Obstetrics and Gynecology | Admitting: Obstetrics and Gynecology

## 2015-03-18 ENCOUNTER — Encounter (HOSPITAL_COMMUNITY): Payer: Self-pay | Admitting: *Deleted

## 2015-03-18 DIAGNOSIS — Z3A28 28 weeks gestation of pregnancy: Secondary | ICD-10-CM | POA: Insufficient documentation

## 2015-03-18 DIAGNOSIS — R109 Unspecified abdominal pain: Secondary | ICD-10-CM

## 2015-03-18 DIAGNOSIS — O26899 Other specified pregnancy related conditions, unspecified trimester: Secondary | ICD-10-CM

## 2015-03-18 DIAGNOSIS — O9989 Other specified diseases and conditions complicating pregnancy, childbirth and the puerperium: Secondary | ICD-10-CM | POA: Diagnosis not present

## 2015-03-18 LAB — URINALYSIS, ROUTINE W REFLEX MICROSCOPIC
Bilirubin Urine: NEGATIVE
GLUCOSE, UA: NEGATIVE mg/dL
Hgb urine dipstick: NEGATIVE
Ketones, ur: NEGATIVE mg/dL
LEUKOCYTES UA: NEGATIVE
Nitrite: NEGATIVE
PH: 6.5 (ref 5.0–8.0)
PROTEIN: NEGATIVE mg/dL
SPECIFIC GRAVITY, URINE: 1.02 (ref 1.005–1.030)
UROBILINOGEN UA: 0.2 mg/dL (ref 0.0–1.0)

## 2015-03-18 NOTE — MAU Provider Note (Signed)
  History     CSN: [id number]  Arrival date and time: 03/18/15 2114   None     Chief Complaint  Patient presents with  . Abdominal Pain   HPI  Patient is 24 y.o. G1P0 [redacted]w[redacted]d here with complaints of sharp, throbbing pain on right side of middle belly.  Thinks it may be where the baby is moving.  Nothing worsens, nothing improves, has not taken any medications for pain.  +FM, denies LOF, VB, contractions, has some vaginal discharge.     Past Medical History  Diagnosis Date  . Medical history non-contributory     Past Surgical History  Procedure Laterality Date  . No past surgeries      Family History  Problem Relation Age of Onset  . Diabetes Sister   . Hypertension Maternal Grandmother   . Hypertension Paternal Grandmother   . Cancer Paternal Grandmother     History  Substance Use Topics  . Smoking status: Never Smoker   . Smokeless tobacco: Not on file  . Alcohol Use: No    Allergies: No Known Allergies  Prescriptions prior to admission  Medication Sig Dispense Refill Last Dose  . Prenatal Vit-Fe Fumarate-FA (PRENATAL COMPLETE) 14-0.4 MG TABS Take 1 tablet by mouth daily. 60 each 1 Past Week at Unknown time    Review of Systems  Constitutional: Negative for fever and chills.  HENT: Negative for congestion.   Respiratory: Negative for cough and shortness of breath.   Cardiovascular: Negative for chest pain and leg swelling.  Gastrointestinal: Positive for nausea and abdominal pain. Negative for heartburn, vomiting and diarrhea.  Genitourinary: Negative for dysuria, urgency, frequency and hematuria.  Skin: Negative for itching and rash.  Neurological: Negative for dizziness, loss of consciousness and headaches.   Physical Exam   Blood pressure 107/62, pulse 75, temperature 98.3 F (36.8 C), resp. rate 18, height 5\' 6"  (1.676 m), weight 138 lb 3.2 oz (62.687 kg), last menstrual period 08/08/2014.  Physical Exam  Constitutional: She is oriented to  person, place, and time. She appears well-developed and well-nourished.  HENT:  Head: Normocephalic and atraumatic.  Eyes: Conjunctivae and EOM are normal.  Neck: Normal range of motion.  Cardiovascular: Normal rate, regular rhythm and normal heart sounds.   Respiratory: Effort normal. No respiratory distress.  GI: Soft. Bowel sounds are normal. She exhibits no distension. There is no tenderness.    Musculoskeletal: Normal range of motion. She exhibits no edema.  Neurological: She is alert and oriented to person, place, and time.  Skin: Skin is warm and dry. No erythema.    MAU Course  Procedures  MDM NST reactive, no contractions  Assessment and Plan  Patient is 24 y.o. G1P0 [redacted]w[redacted]d reporting abdominal pain likely secondary to normal discomforts of pregnancy - fetal kick counts reinforced - preterm labor precautions: NO signs of contractions, no signs of preterm labor, abdomen very soft and non-tender during exam although patient reported pain currently.   Merrin Mcvicker ROCIO 03/18/2015, 10:03 PM

## 2015-03-18 NOTE — Discharge Instructions (Signed)
Abdominal Pain During Pregnancy °Belly (abdominal) pain is common during pregnancy. Most of the time, it is not a serious problem. Other times, it can be a sign that something is wrong with the pregnancy. Always tell your doctor if you have belly pain. °HOME CARE °Monitor your belly pain for any changes. The following actions may help you feel better: °· Do not have sex (intercourse) or put anything in your vagina until you feel better. °· Rest until your pain stops. °· Drink clear fluids if you feel sick to your stomach (nauseous). Do not eat solid food until you feel better. °· Only take medicine as told by your doctor. °· Keep all doctor visits as told. °GET HELP RIGHT AWAY IF:  °· You are bleeding, leaking fluid, or pieces of tissue come out of your vagina. °· You have more pain or cramping. °· You keep throwing up (vomiting). °· You have pain when you pee (urinate) or have blood in your pee. °· You have a fever. °· You do not feel your baby moving as much. °· You feel very weak or feel like passing out. °· You have trouble breathing, with or without belly pain. °· You have a very bad headache and belly pain. °· You have fluid leaking from your vagina and belly pain. °· You keep having watery poop (diarrhea). °· Your belly pain does not go away after resting, or the pain gets worse. °MAKE SURE YOU:  °· Understand these instructions. °· Will watch your condition. °· Will get help right away if you are not doing well or get worse. °Document Released: 09/12/2009 Document Revised: 05/27/2013 Document Reviewed: 04/23/2013 °ExitCare® Patient Information ©2015 [employer]. This information is not intended to replace advice given to you by your health care provider. Make sure you discuss any questions you have with your health care provider. ° °

## 2015-03-18 NOTE — MAU Note (Signed)
Having sharp, throbbing pain R side of abd for couple days. Denies bleeding or LOF.

## 2015-03-22 ENCOUNTER — Encounter (HOSPITAL_COMMUNITY): Payer: Self-pay | Admitting: *Deleted

## 2015-03-22 ENCOUNTER — Inpatient Hospital Stay (HOSPITAL_COMMUNITY)
Admission: AD | Admit: 2015-03-22 | Discharge: 2015-03-22 | Disposition: A | Discharge status: Home / Self Care | Payer: Medicaid Other | Source: Ambulatory Visit | Attending: Family Medicine | Admitting: Family Medicine

## 2015-03-22 DIAGNOSIS — O4703 False labor before 37 completed weeks of gestation, third trimester: Secondary | ICD-10-CM | POA: Diagnosis not present

## 2015-03-22 DIAGNOSIS — R109 Unspecified abdominal pain: Secondary | ICD-10-CM | POA: Diagnosis present

## 2015-03-22 DIAGNOSIS — Z3A28 28 weeks gestation of pregnancy: Secondary | ICD-10-CM | POA: Insufficient documentation

## 2015-03-22 DIAGNOSIS — O479 False labor, unspecified: Secondary | ICD-10-CM

## 2015-03-22 LAB — URINALYSIS, ROUTINE W REFLEX MICROSCOPIC
BILIRUBIN URINE: NEGATIVE
GLUCOSE, UA: NEGATIVE mg/dL
Hgb urine dipstick: NEGATIVE
KETONES UR: NEGATIVE mg/dL
LEUKOCYTES UA: NEGATIVE
Nitrite: NEGATIVE
PH: 6 (ref 5.0–8.0)
Protein, ur: NEGATIVE mg/dL
Specific Gravity, Urine: 1.015 (ref 1.005–1.030)
Urobilinogen, UA: 0.2 mg/dL (ref 0.0–1.0)

## 2015-03-22 NOTE — MAU Provider Note (Signed)
  History     CSN: [id number]  Arrival date and time: 03/22/15 2131   None     No chief complaint on file.  HPI 24 y.o. G1P0 [redacted]w[redacted]d presents to the MAU stating that she felt her belly getting tight and pressure in her vagina earlier today when she was at work. Denies vaginal bleeding , LOF and reports positive fetal movement.  Past Medical History  Diagnosis Date  . Medical history non-contributory     Past Surgical History  Procedure Laterality Date  . No past surgeries      Family History  Problem Relation Age of Onset  . Diabetes Sister   . Hypertension Maternal Grandmother   . Hypertension Paternal Grandmother   . Cancer Paternal Grandmother     History  Substance Use Topics  . Smoking status: Never Smoker   . Smokeless tobacco: Not on file  . Alcohol Use: No    Allergies: No Known Allergies  Prescriptions prior to admission  Medication Sig Dispense Refill Last Dose  . Prenatal Vit-Fe Fumarate-FA (PRENATAL COMPLETE) 14-0.4 MG TABS Take 1 tablet by mouth daily. 60 each 1 Past Week at Unknown time    Review of Systems  Constitutional: Negative.   Gastrointestinal: Negative for abdominal pain.  Genitourinary: Negative for dysuria, urgency and frequency.  All other systems reviewed and are negative.  Physical Exam   Blood pressure 104/68, pulse 89, temperature 98.3 F (36.8 C), temperature source Oral, resp. rate 20, height 5\' 5"  (1.651 m), weight 64.921 kg (143 lb 2 oz), last menstrual period 08/08/2014.  Results for orders placed or performed during the hospital encounter of 03/22/15 (from the past 24 hour(s))  Urinalysis, Routine w reflex microscopic (not at [facility])     Status: None   Collection Time: 03/22/15  9:55 PM  Result Value Ref Range   Color, Urine YELLOW YELLOW   APPearance CLEAR CLEAR   Specific Gravity, Urine 1.015 1.005 - 1.030   pH 6.0 5.0 - 8.0   Glucose, UA NEGATIVE NEGATIVE mg/dL   Hgb urine dipstick NEGATIVE NEGATIVE   Bilirubin  Urine NEGATIVE NEGATIVE   Ketones, ur NEGATIVE NEGATIVE mg/dL   Protein, ur NEGATIVE NEGATIVE mg/dL   Urobilinogen, UA 0.2 0.0 - 1.0 mg/dL   Nitrite NEGATIVE NEGATIVE   Leukocytes, UA NEGATIVE NEGATIVE   Physical Exam  Nursing note and vitals reviewed. Constitutional: She is oriented to person, place, and time. She appears well-developed and well-nourished. No distress.  HENT:  Head: Normocephalic.  Neck: Normal range of motion.  Cardiovascular: Normal rate.   Respiratory: Effort normal. No respiratory distress.  Musculoskeletal: Normal range of motion. She exhibits no edema.  Neurological: She is alert and oriented to person, place, and time.  Skin: Skin is warm and dry.  Psychiatric: She has a normal mood and affect. Her behavior is normal. Judgment and thought content normal.    MAU Course  Procedures  MDM Pt states she has been under stress for the past 2 days and probably having normal discomfort of pregnancy. She is not having any contractions and her urine is negative Fht's Category 1 .Reviewed preterm labor precautions.  Assessment and Plan  Braxton Hicks contractions  Discharge to home Keep next regular scheduled appointment  Sumaya Riedesel Grissett 03/22/2015, 10:28 PM

## 2015-03-22 NOTE — Discharge Instructions (Signed)
Braxton Hicks Contractions °Contractions of the uterus can occur throughout pregnancy. Contractions are not always a sign that you are in labor.  °WHAT ARE BRAXTON HICKS CONTRACTIONS?  °Contractions that occur before labor are called Braxton Hicks contractions, or false labor. Toward the end of pregnancy (32-34 weeks), these contractions can develop more often and may become more forceful. This is not true labor because these contractions do not result in opening (dilatation) and thinning of the cervix. They are sometimes difficult to tell apart from true labor because these contractions can be forceful and people have different pain tolerances. You should not feel embarrassed if you go to the hospital with false labor. Sometimes, the only way to tell if you are in true labor is for your health care provider to look for changes in the cervix. °If there are no prenatal problems or other health problems associated with the pregnancy, it is completely safe to be sent home with false labor and await the onset of true labor. °HOW CAN YOU TELL THE DIFFERENCE BETWEEN TRUE AND FALSE LABOR? °False Labor °· The contractions of false labor are usually shorter and not as hard as those of true labor.   °· The contractions are usually irregular.   °· The contractions are often felt in the front of the lower abdomen and in the groin.   °· The contractions may go away when you walk around or change positions while lying down.   °· The contractions get weaker and are shorter lasting as time goes on.   °· The contractions do not usually become progressively stronger, regular, and closer together as with true labor.   °True Labor °· Contractions in true labor last 30-70 seconds, become very regular, usually become more intense, and increase in frequency.   °· The contractions do not go away with walking.   °· The discomfort is usually felt in the top of the uterus and spreads to the lower abdomen and low back.   °· True labor can be  determined by your health care provider with an exam. This will show that the cervix is dilating and getting thinner.   °WHAT TO REMEMBER °· Keep up with your usual exercises and follow other instructions given by your health care provider.   °· Take medicines as directed by your health care provider.   °· Keep your regular prenatal appointments.   °· Eat and drink lightly if you think you are going into labor.   °· If Braxton Hicks contractions are making you uncomfortable:   °· Change your position from lying down or resting to walking, or from walking to resting.   °· Sit and rest in a tub of warm water.   °· Drink 2-3 glasses of water. Dehydration may cause these contractions.   °· Do slow and deep breathing several times an hour.   °WHEN SHOULD I SEEK IMMEDIATE MEDICAL CARE? °Seek immediate medical care if: °· Your contractions become stronger, more regular, and closer together.   °· You have fluid leaking or gushing from your vagina.   °· You have a fever.   °· You pass blood-tinged mucus.   °· You have vaginal bleeding.   °· You have continuous abdominal pain.   °· You have low back pain that you never had before.   °· You feel your baby's head pushing down and causing pelvic pressure.   °· Your baby is not moving as much as it used to.   °Document Released: 09/24/2005 Document Revised: 09/29/2013 Document Reviewed: 07/06/2013 °ExitCare® Patient Information ©2015 [employer]. This information is not intended to replace advice given to you by your health care   provider. Make sure you discuss any questions you have with your health care provider. °Preterm Labor Information °Preterm labor is when labor starts at less than 37 weeks of pregnancy. The normal length of a pregnancy is 39 to 41 weeks. °CAUSES °Often, there is no identifiable underlying cause as to why a woman goes into preterm labor. One of the most common known causes of preterm labor is infection. Infections of the uterus, cervix, vagina, amniotic  sac, bladder, kidney, or even the lungs (pneumonia) can cause labor to start. Other suspected causes of preterm labor include:  °· Urogenital infections, such as yeast infections and bacterial vaginosis.   °· Uterine abnormalities (uterine shape, uterine septum, fibroids, or bleeding from the placenta).   °· A cervix that has been operated on (it may fail to stay closed).   °· Malformations in the fetus.   °· Multiple gestations (twins, triplets, and so on).   °· Breakage of the amniotic sac.   °RISK FACTORS °· Having a previous history of preterm labor.   °· Having premature rupture of membranes (PROM).   °· Having a placenta that covers the opening of the cervix (placenta previa).   °· Having a placenta that separates from the uterus (placental abruption).   °· Having a cervix that is too weak to hold the fetus in the uterus (incompetent cervix).   °· Having too much fluid in the amniotic sac (polyhydramnios).   °· Taking illegal drugs or smoking while pregnant.   °· Not gaining enough weight while pregnant.   °· Being younger than 18 and older than 22 years old.   °· Having a low socioeconomic status.   °· Being African American. °SYMPTOMS °Signs and symptoms of preterm labor include:  °· Menstrual-like cramps, abdominal pain, or back pain. °· Uterine contractions that are regular, as frequent as six in an hour, regardless of their intensity (may be mild or painful). °· Contractions that start on the top of the uterus and spread down to the lower abdomen and back.   °· A sense of increased pelvic pressure.   °· A watery or bloody mucus discharge that comes from the vagina.   °TREATMENT °Depending on the length of the pregnancy and other circumstances, your health care provider may suggest bed rest. If necessary, there are medicines that can be given to stop contractions and to mature the fetal lungs. If labor happens before 34 weeks of pregnancy, a prolonged hospital stay may be recommended. Treatment depends on  the condition of both you and the fetus.  °WHAT SHOULD YOU DO IF YOU THINK YOU ARE IN PRETERM LABOR? °Call your health care provider right away. You will need to go to the hospital to get checked immediately. °HOW CAN YOU PREVENT PRETERM LABOR IN FUTURE PREGNANCIES? °You should:  °· Stop smoking if you smoke.  °· Maintain healthy weight gain and avoid chemicals and drugs that are not necessary. °· Be watchful for any type of infection. °· Inform your health care provider if you have a known history of preterm labor. °Document Released: 12/15/2003 Document Revised: 05/27/2013 Document Reviewed: 10/27/2012 °ExitCare® Patient Information ©2015 [employer]. This information is not intended to replace advice given to you by your health care provider. Make sure you discuss any questions you have with your health care provider. ° °

## 2015-03-22 NOTE — MAU Note (Signed)
PT SAYS  SHE WAS HERE LAST Friday  FOR  SHARP PAIN IN ABD-  SAYS  PAIN NEVER WENT  AWAY-   AND  SINCE  Sunday  HAS BEEN FEELING  PRESSURE-  TODAY PRESSURE IS  WORSE.    AND   HAS HEAVY D/C- WHITE- NO ODOR.    FEELS  NAUSEA-  NO VOMITING- ATE  LAST  AT  7PM-   ATE HOT DOG  AND  CHICKEN  NUGGETS.  FEELS  LIGHT  HEADED-  STARTED   AT 6 PM-  CALLED  NURSE  LINE.Marland Kitchen  PNC-   WITH  CLINIC.

## 2015-03-30 ENCOUNTER — Encounter: Payer: BLUE CROSS/BLUE SHIELD | Admitting: Certified Nurse Midwife

## 2015-03-30 ENCOUNTER — Other Ambulatory Visit: Payer: BLUE CROSS/BLUE SHIELD

## 2015-03-30 ENCOUNTER — Encounter: Payer: Self-pay | Admitting: Obstetrics & Gynecology

## 2015-03-30 DIAGNOSIS — Z3403 Encounter for supervision of normal first pregnancy, third trimester: Secondary | ICD-10-CM

## 2015-03-30 DIAGNOSIS — Z3493 Encounter for supervision of normal pregnancy, unspecified, third trimester: Secondary | ICD-10-CM

## 2015-03-30 LAB — CBC
HEMATOCRIT: 35.3 % — AB (ref 36.0–46.0)
HEMOGLOBIN: 11.6 g/dL — AB (ref 12.0–15.0)
MCH: 27.3 pg (ref 26.0–34.0)
MCHC: 32.9 g/dL (ref 30.0–36.0)
MCV: 83.1 fL (ref 78.0–100.0)
MPV: 9.3 fL (ref 8.6–12.4)
Platelets: 332 10*3/uL (ref 150–400)
RBC: 4.25 MIL/uL (ref 3.87–5.11)
RDW: 13.7 % (ref 11.5–15.5)
WBC: 11 10*3/uL — ABNORMAL HIGH (ref 4.0–10.5)

## 2015-03-31 LAB — HIV ANTIBODY (ROUTINE TESTING W REFLEX): HIV: NONREACTIVE

## 2015-03-31 LAB — RPR

## 2015-03-31 LAB — GLUCOSE TOLERANCE, 1 HOUR (50G) W/O FASTING: GLUCOSE 1 HOUR GTT: 123 mg/dL (ref 70–140)

## 2015-04-06 ENCOUNTER — Ambulatory Visit (INDEPENDENT_AMBULATORY_CARE_PROVIDER_SITE_OTHER): Payer: BLUE CROSS/BLUE SHIELD | Admitting: Family Medicine

## 2015-04-06 ENCOUNTER — Encounter: Payer: Self-pay | Admitting: Family Medicine

## 2015-04-06 VITALS — BP 104/60 | HR 89 | Temp 98.9°F | Wt 146.0 lb

## 2015-04-06 DIAGNOSIS — Z3492 Encounter for supervision of normal pregnancy, unspecified, second trimester: Secondary | ICD-10-CM

## 2015-04-06 LAB — POCT URINALYSIS DIP (DEVICE)
Bilirubin Urine: NEGATIVE
Glucose, UA: NEGATIVE mg/dL
Hgb urine dipstick: NEGATIVE
Ketones, ur: NEGATIVE mg/dL
Nitrite: NEGATIVE
Protein, ur: NEGATIVE mg/dL
Specific Gravity, Urine: 1.02 (ref 1.005–1.030)
Urobilinogen, UA: 0.2 mg/dL (ref 0.0–1.0)
pH: 6 (ref 5.0–8.0)

## 2015-04-06 NOTE — Progress Notes (Signed)
Patient is 24 y.o. G1P0 [id number].  +FM, denies LOF, VB, vaginal discharge.  Overall feeling well. - having intermittent abdominal pain, seen in MAU for pain - 1+ pitting edema, 2+ reflexes, no HA/visual changes or RUQ pain - weight gain reviewed

## 2015-04-06 NOTE — Patient Instructions (Signed)
   Patient will be coming to clinic every 2 weeks for 2 visits and then weekly.  Please excuse for prenatal care/doctor visits.

## 2015-04-11 ENCOUNTER — Encounter (HOSPITAL_COMMUNITY): Payer: Self-pay

## 2015-04-11 ENCOUNTER — Inpatient Hospital Stay (HOSPITAL_COMMUNITY)
Admission: AD | Admit: 2015-04-11 | Discharge: 2015-04-11 | Disposition: A | Discharge status: Home / Self Care | Payer: BLUE CROSS/BLUE SHIELD | Source: Ambulatory Visit | Attending: Obstetrics & Gynecology | Admitting: Obstetrics & Gynecology

## 2015-04-11 ENCOUNTER — Inpatient Hospital Stay (HOSPITAL_COMMUNITY): Payer: BLUE CROSS/BLUE SHIELD

## 2015-04-11 DIAGNOSIS — Z3A31 31 weeks gestation of pregnancy: Secondary | ICD-10-CM | POA: Insufficient documentation

## 2015-04-11 DIAGNOSIS — Z0489 Encounter for examination and observation for other specified reasons: Secondary | ICD-10-CM

## 2015-04-11 DIAGNOSIS — O26879 Cervical shortening, unspecified trimester: Secondary | ICD-10-CM | POA: Diagnosis present

## 2015-04-11 DIAGNOSIS — O26873 Cervical shortening, third trimester: Secondary | ICD-10-CM | POA: Diagnosis not present

## 2015-04-11 DIAGNOSIS — O4693 Antepartum hemorrhage, unspecified, third trimester: Secondary | ICD-10-CM | POA: Insufficient documentation

## 2015-04-11 DIAGNOSIS — IMO0002 Reserved for concepts with insufficient information to code with codable children: Secondary | ICD-10-CM

## 2015-04-11 LAB — WET PREP, GENITAL
CLUE CELLS WET PREP: NONE SEEN
TRICH WET PREP: NONE SEEN
Yeast Wet Prep HPF POC: NONE SEEN

## 2015-04-11 LAB — URINALYSIS, ROUTINE W REFLEX MICROSCOPIC
Bilirubin Urine: NEGATIVE
Glucose, UA: NEGATIVE mg/dL
Ketones, ur: NEGATIVE mg/dL
Leukocytes, UA: NEGATIVE
Nitrite: NEGATIVE
PROTEIN: NEGATIVE mg/dL
Specific Gravity, Urine: 1.015 (ref 1.005–1.030)
UROBILINOGEN UA: 0.2 mg/dL (ref 0.0–1.0)
pH: 7 (ref 5.0–8.0)

## 2015-04-11 LAB — URINE MICROSCOPIC-ADD ON

## 2015-04-11 LAB — OB RESULTS CONSOLE GC/CHLAMYDIA: Gonorrhea: NEGATIVE

## 2015-04-11 LAB — FETAL FIBRONECTIN: Fetal Fibronectin: NEGATIVE

## 2015-04-11 MED ORDER — BETAMETHASONE SOD PHOS & ACET 6 (3-3) MG/ML IJ SUSP
12.0000 mg | Freq: Once | INTRAMUSCULAR | Status: AC
  Administered 2015-04-11: 12 mg via INTRAMUSCULAR
  Filled 2015-04-11: qty 2

## 2015-04-11 NOTE — MAU Note (Signed)
Notified D. Poe CNM patient here with c/o onset of light vaginal bleeding since yesterday with pelvic pressure, denies any previous problems with vaginal bleeding or problems with placenta.

## 2015-04-11 NOTE — MAU Provider Note (Signed)
History     CSN: [id number]  Arrival date and time: 04/11/15 1217   First Provider Initiated Contact with Patient 04/11/15 1301      Chief Complaint  Patient presents with  . Vaginal Bleeding  . Abdominal Pain   HPI Comments: Olivia Cooper is a 24 y.o. G1P0 at [redacted]w[redacted]d presents reporting onset 12 MN of reddish brown light vaginal bleeding like last day of menses. Continued today but not heavy enough to wear a pad. Intermittent cramping and pelvic pressure x 2 wks. Good FM. No leakage of fluid. Last intercourse months ago. No drug use.  PNC at [facility] essentially uncomplicated, except ASCUS on Pap and marginal insertion of umbilical cord. Has [place] scheduled to complete anatomic survey.    Vaginal Bleeding The patient's primary symptoms include a genital odor, pelvic pain and vaginal bleeding. The patient's pertinent negatives include no genital itching, genital lesions, genital rash or vaginal discharge. This is a new (No prior bleeding episodes. ) problem. The current episode started today. The problem occurs intermittently. The problem has been unchanged. The pain is mild. The problem affects both sides. She is pregnant. Associated symptoms include abdominal pain. Pertinent negatives include no constipation, diarrhea, discolored urine, dysuria, fever, flank pain, frequency, hematuria or nausea. Associated symptoms comments: Denies hemorrhoids bleeding. The vaginal discharge was bloody. She has not been passing clots. She has not been passing tissue. Nothing aggravates the symptoms. She has tried nothing for the symptoms. Sexual activity: Last intercourse months ago.  Abdominal Pain Pertinent negatives include no constipation, diarrhea, dysuria, fever, frequency, hematuria or nausea.    OB History  Gravida Para Term Preterm AB SAB TAB Ectopic Multiple Living  1             # Outcome Date GA Lbr Len/2nd Weight Sex Delivery Anes PTL Lv  1 Current               Past Medical History   Diagnosis Date  . Medical history non-contributory     Past Surgical History  Procedure Laterality Date  . No past surgeries      Family History  Problem Relation Age of Onset  . Diabetes Sister   . Hypertension Maternal Grandmother   . Hypertension Paternal Grandmother   . Cancer Paternal Grandmother     History  Substance Use Topics  . Smoking status: Never Smoker   . Smokeless tobacco: Not on file  . Alcohol Use: No    Allergies: No Known Allergies  Prescriptions prior to admission  Medication Sig Dispense Refill Last Dose  . Prenatal Vit-Fe Fumarate-FA (PRENATAL COMPLETE) 14-0.4 MG TABS Take 1 tablet by mouth daily. 60 each 1 Past Week at Unknown time    Review of Systems  Constitutional: Negative for fever.  Gastrointestinal: Positive for abdominal pain. Negative for nausea, diarrhea and constipation.  Genitourinary: Positive for vaginal bleeding and pelvic pain. Negative for dysuria, frequency, hematuria, flank pain and vaginal discharge.   Physical Exam   Blood pressure 112/63, pulse 109, temperature 98.6 F (37 C), temperature source Oral, resp. rate 16, height  (1.651 m), weight 66.407 kg (146 lb 6.4 oz), last menstrual period 08/08/2014.  Physical Exam  Nursing note and vitals reviewed. Constitutional: She is oriented to person, place, and time. She appears well-developed and well-nourished. No distress.  HENT:  Head: Normocephalic.  Eyes: Pupils are equal, round, and reactive to light. No scleral icterus.  Neck: Normal range of motion.  Cardiovascular: Normal rate.   Respiratory:  Effort normal. No respiratory distress.  GI: Soft. There is no tenderness.  Genitourinary:  NEFG. Vagina with homogenous white discharge. No blood noted. No vaginal or cervical lesions noted. Fleshy hemorrhoidal tag, not bleeding. SVE: posterior, closed/2cm long/-1 to -2 vtx fixed  Musculoskeletal: Normal range of motion.  Neurological: She is alert and oriented to  person, place, and time.  Skin: Skin is warm and dry. No erythema.  Psychiatric: She has a normal mood and affect. Her behavior is normal. Thought content normal.  Anxious especially with pelvic    MAU Course  Procedures Results for orders placed or performed during the hospital encounter of 04/11/15 (from the past 24 hour(s))  Urinalysis, Routine w reflex microscopic (not [facility])     Status: Abnormal   Collection Time: 04/11/15 12:29 PM  Result Value Ref Range   Color, Urine YELLOW YELLOW   APPearance CLEAR CLEAR   Specific Gravity, Urine 1.015 1.005 - 1.030   pH 7.0 5.0 - 8.0   Glucose, UA NEGATIVE NEGATIVE mg/dL   Hgb urine dipstick MODERATE (A) NEGATIVE   Bilirubin Urine NEGATIVE NEGATIVE   Ketones, ur NEGATIVE NEGATIVE mg/dL   Protein, ur NEGATIVE NEGATIVE mg/dL   Urobilinogen, UA 0.2 0.0 - 1.0 mg/dL   Nitrite NEGATIVE NEGATIVE   Leukocytes, UA NEGATIVE NEGATIVE  Urine microscopic-add on     Status: None   Collection Time: 04/11/15 12:29 PM  Result Value Ref Range   Squamous Epithelial / LPF RARE RARE   WBC, UA 0-2 <3 WBC/hpf   RBC / HPF 0-2 <3 RBC/hpf   Bacteria, UA RARE RARE  Wet prep, genital     Status: Abnormal   Collection Time: 04/11/15  1:20 PM  Result Value Ref Range   Yeast Wet Prep HPF POC NONE SEEN NONE SEEN   Trich, Wet Prep NONE SEEN NONE SEEN   Clue Cells Wet Prep HPF POC NONE SEEN NONE SEEN   WBC, Wet Prep HPF POC FEW (A) NONE SEEN  Fetal fibronectin     Status: None   Collection Time: 04/11/15  1:20 PM  Result Value Ref Range   Fetal Fibronectin NEGATIVE NEGATIVE  GC/CT sent   MDM Cervical shortening with third tri VB by hx and sx of PTL. Consulted with Dr. Penne LashLeggett> Betamethasone now and repeat in 24 hr in [facility].   Assessment and Plan   1. Evaluate anatomy not seen on prior sonogram   2. Vaginal bleeding in pregnancy, third trimester   3. Short cervical length during pregnancy, third trimester   Reassuring EFM  Discharge with PTL  precautions   Meds ordered this encounter  Medications  . betamethasone acetate-betamethasone sodium phosphate (CELESTONE) injection 12 mg    Sig:    Follow-up Information    [facility] In 1 day.   Specialty:  Obstetrics and Gynecology   Why:  Repeat betamethasone    Contact information:   [address] [place] [id number] [phone]      POE,DEIRDRE 04/11/2015, 1:21 PM   No bleeding seen on exam here, per CNM Poe.  No evidence of abruption.  Attestation of Attending Supervision of Advanced Practitioner (CNM/NP): Evaluation and management procedures were performed by the Advanced Practitioner under my supervision and collaboration. I have reviewed the Advanced Practitioner's note and chart, and I agree with the management and plan.  Ailanie Ruttan H. 8:14 AM

## 2015-04-11 NOTE — Discharge Instructions (Signed)
Fetal Movement Counts °Patient Name: __________________________________________________ Patient Due Date: ____________________ °Performing a fetal movement count is highly recommended in high-risk pregnancies, but it is good for every pregnant woman to do. Your health care provider may ask you to start counting fetal movements at 28 weeks of the pregnancy. Fetal movements often increase: °· After eating a full meal. °· After physical activity. °· After eating or drinking something sweet or cold. °· At rest. °Pay attention to when you feel the baby is most active. This will help you notice a pattern of your baby's sleep and wake cycles and what factors contribute to an increase in fetal movement. It is important to perform a fetal movement count at the same time each day when your baby is normally most active.  °HOW TO COUNT FETAL MOVEMENTS °1. Find a quiet and comfortable area to sit or lie down on your left side. Lying on your left side provides the best blood and oxygen circulation to your baby. °2. Write down the day and time on a sheet of paper or in a journal. °3. Start counting kicks, flutters, swishes, rolls, or jabs in a 2-hour period. You should feel at least 10 movements within 2 hours. °4. If you do not feel 10 movements in 2 hours, wait 2-3 hours and count again. Look for a change in the pattern or not enough counts in 2 hours. °SEEK MEDICAL CARE IF: °· You feel less than 10 counts in 2 hours, tried twice. °· There is no movement in over an hour. °· The pattern is changing or taking longer each day to reach 10 counts in 2 hours. °· You feel the baby is not moving as he or she usually does. °Date: ____________ Movements: ____________ Start time: ____________ Finish time: ____________  °Date: ____________ Movements: ____________ Start time: ____________ Finish time: ____________ °Date: ____________ Movements: ____________ Start time: ____________ Finish time: ____________ °Date: ____________ Movements:  ____________ Start time: ____________ Finish time: ____________ °Date: ____________ Movements: ____________ Start time: ____________ Finish time: ____________ °Date: ____________ Movements: ____________ Start time: ____________ Finish time: ____________ °Date: ____________ Movements: ____________ Start time: ____________ Finish time: ____________ °Date: ____________ Movements: ____________ Start time: ____________ Finish time: ____________  °Date: ____________ Movements: ____________ Start time: ____________ Finish time: ____________ °Date: ____________ Movements: ____________ Start time: ____________ Finish time: ____________ °Date: ____________ Movements: ____________ Start time: ____________ Finish time: ____________ °Date: ____________ Movements: ____________ Start time: ____________ Finish time: ____________ °Date: ____________ Movements: ____________ Start time: ____________ Finish time: ____________ °Date: ____________ Movements: ____________ Start time: ____________ Finish time: ____________ °Date: ____________ Movements: ____________ Start time: ____________ Finish time: ____________  °Date: ____________ Movements: ____________ Start time: ____________ Finish time: ____________ °Date: ____________ Movements: ____________ Start time: ____________ Finish time: ____________ °Date: ____________ Movements: ____________ Start time: ____________ Finish time: ____________ °Date: ____________ Movements: ____________ Start time: ____________ Finish time: ____________ °Date: ____________ Movements: ____________ Start time: ____________ Finish time: ____________ °Date: ____________ Movements: ____________ Start time: ____________ Finish time: ____________ °Date: ____________ Movements: ____________ Start time: ____________ Finish time: ____________  °Date: ____________ Movements: ____________ Start time: ____________ Finish time: ____________ °Date: ____________ Movements: ____________ Start time: ____________ Finish  time: ____________ °Date: ____________ Movements: ____________ Start time: ____________ Finish time: ____________ °Date: ____________ Movements: ____________ Start time: ____________ Finish time: ____________ °Date: ____________ Movements: ____________ Start time: ____________ Finish time: ____________ °Date: ____________ Movements: ____________ Start time: ____________ Finish time: ____________ °Date: ____________ Movements: ____________ Start time: ____________ Finish time: ____________  °Date: ____________ Movements: ____________ Start time: ____________ Finish   time: ____________ Date: ____________ Movements: ____________ Start time: ____________ Doreatha MartinFinish time: ____________ Date: ____________ Movements: ____________ Start time: ____________ Doreatha MartinFinish time: ____________ Date: ____________ Movements: ____________ Start time: ____________ Doreatha MartinFinish time: ____________ Date: ____________ Movements: ____________ Start time: ____________ Doreatha MartinFinish time: ____________ Date: ____________ Movements: ____________ Start time: ____________ Doreatha MartinFinish time: ____________ Date: ____________ Movements: ____________ Start time: ____________ Doreatha MartinFinish time: ____________  Date: ____________ Movements: ____________ Start time: ____________ Doreatha MartinFinish time: ____________ Date: ____________ Movements: ____________ Start time: ____________ Doreatha MartinFinish time: ____________ Date: ____________ Movements: ____________ Start time: ____________ Doreatha MartinFinish time: ____________ Date: ____________ Movements: ____________ Start time: ____________ Doreatha MartinFinish time: ____________ Date: ____________ Movements: ____________ Start time: ____________ Doreatha MartinFinish time: ____________ Date: ____________ Movements: ____________ Start time: ____________ Doreatha MartinFinish time: ____________ Date: ____________ Movements: ____________ Start time: ____________ Doreatha MartinFinish time: ____________  Date: ____________ Movements: ____________ Start time: ____________ Doreatha MartinFinish time: ____________ Date: ____________  Movements: ____________ Start time: ____________ Doreatha MartinFinish time: ____________ Date: ____________ Movements: ____________ Start time: ____________ Doreatha MartinFinish time: ____________ Date: ____________ Movements: ____________ Start time: ____________ Doreatha MartinFinish time: ____________ Date: ____________ Movements: ____________ Start time: ____________ Doreatha MartinFinish time: ____________ Date: ____________ Movements: ____________ Start time: ____________ Doreatha MartinFinish time: ____________ Date: ____________ Movements: ____________ Start time: ____________ Doreatha MartinFinish time: ____________  Date: ____________ Movements: ____________ Start time: ____________ Doreatha MartinFinish time: ____________ Date: ____________ Movements: ____________ Start time: ____________ Doreatha MartinFinish time: ____________ Date: ____________ Movements: ____________ Start time: ____________ Doreatha MartinFinish time: ____________ Date: ____________ Movements: ____________ Start time: ____________ Doreatha MartinFinish time: ____________ Date: ____________ Movements: ____________ Start time: ____________ Doreatha MartinFinish time: ____________ Date: ____________ Movements: ____________ Start time: ____________ Doreatha MartinFinish time: ____________ Document Released: 10/24/2006 Document Revised: 02/08/2014 Document Reviewed: 07/21/2012 [employer] Patient Information 2015 [employer]. This information is not intended to replace advice given to you by your health care provider. Make sure you discuss any questions you have with your health care provider. Preterm Labor Information Preterm labor is when labor starts at less than 37 weeks of pregnancy. The normal length of a pregnancy is 39 to 41 weeks. CAUSES Often, there is no identifiable underlying cause as to why a woman goes into preterm labor. One of the most common known causes of preterm labor is infection. Infections of the uterus, cervix, vagina, amniotic sac, bladder, kidney, or even the lungs (pneumonia) can cause labor to start. Other suspected causes of preterm labor include:   Urogenital  infections, such as yeast infections and bacterial vaginosis.   Uterine abnormalities (uterine shape, uterine septum, fibroids, or bleeding from the placenta).   A cervix that has been operated on (it may fail to stay closed).   Malformations in the fetus.   Multiple gestations (twins, triplets, and so on).   Breakage of the amniotic sac.  RISK FACTORS 5. Having a previous history of preterm labor.  6. Having premature rupture of membranes (PROM).  7. Having a placenta that covers the opening of the cervix (placenta previa).  8. Having a placenta that separates from the uterus (placental abruption).  9. Having a cervix that is too weak to hold the fetus in the uterus (incompetent cervix).  10. Having too much fluid in the amniotic sac (polyhydramnios).  11. Taking illegal drugs or smoking while pregnant.  12. Not gaining enough weight while pregnant.  13. Being younger than 3118 and older than 22 years old.  14. Having a low socioeconomic status.  15. Being African American. SYMPTOMS Signs and symptoms of preterm labor include:   Menstrual-like cramps, abdominal pain, or back pain.  Uterine contractions that are regular, as frequent as six in an  hour, regardless of their intensity (may be mild or painful).  Contractions that start on the top of the uterus and spread down to the lower abdomen and back.   A sense of increased pelvic pressure.   A watery or bloody mucus discharge that comes from the vagina.  TREATMENT Depending on the length of the pregnancy and other circumstances, your health care provider may suggest bed rest. If necessary, there are medicines that can be given to stop contractions and to mature the fetal lungs. If labor happens before 34 weeks of pregnancy, a prolonged hospital stay may be recommended. Treatment depends on the condition of both you and the fetus.  WHAT SHOULD YOU DO IF YOU THINK YOU ARE IN PRETERM LABOR? Call your health care  provider right away. You will need to go to the hospital to get checked immediately. HOW CAN YOU PREVENT PRETERM LABOR IN FUTURE PREGNANCIES? You should:   Stop smoking if you smoke.  Maintain healthy weight gain and avoid chemicals and drugs that are not necessary.  Be watchful for any type of infection.  Inform your health care provider if you have a known history of preterm labor. Document Released: 12/15/2003 Document Revised: 05/27/2013 Document Reviewed: 10/27/2012 [facility] Patient Information 2015 [place], [place]. This information is not intended to replace advice given to you by your health care provider. Make sure you discuss any questions you have with your health care provider.   Follow up in [facility] tomorrow 04/12/15 at 3:00 p.m.

## 2015-04-11 NOTE — MAU Note (Signed)
Onset of vaginal bleeding since yesterday like the last day of a period, pelvic pressure.

## 2015-04-12 ENCOUNTER — Encounter: Payer: Self-pay | Admitting: Obstetrics & Gynecology

## 2015-04-12 ENCOUNTER — Other Ambulatory Visit (INDEPENDENT_AMBULATORY_CARE_PROVIDER_SITE_OTHER): Payer: Medicaid Other | Admitting: *Deleted

## 2015-04-12 LAB — GC/CHLAMYDIA PROBE AMP (~~LOC~~) NOT AT ARMC
Chlamydia: NEGATIVE
Neisseria Gonorrhea: NEGATIVE

## 2015-04-12 MED ORDER — BETAMETHASONE SOD PHOS & ACET 6 (3-3) MG/ML IJ SUSP
12.0000 mg | Freq: Once | INTRAMUSCULAR | Status: AC
  Administered 2015-04-12: 12 mg via INTRAMUSCULAR

## 2015-04-12 NOTE — Progress Notes (Signed)
Pt at clinic for dose 2 of 2 for betamethasone injection for preterm labor.  Pt denies any problems today. Dr. Erin FullingHarraway Smith reviewed chart and states pt can go back to work and keep appointment next week.  Pt verbalizes understanding.

## 2015-04-13 ENCOUNTER — Encounter: Payer: Self-pay | Admitting: Obstetrics & Gynecology

## 2015-04-18 ENCOUNTER — Inpatient Hospital Stay (HOSPITAL_COMMUNITY)
Admission: EM | Admit: 2015-04-18 | Discharge: 2015-04-18 | Discharge status: Against Medical Advice | Payer: Medicaid Other | Source: Ambulatory Visit | Attending: Obstetrics & Gynecology | Admitting: Obstetrics & Gynecology

## 2015-04-18 ENCOUNTER — Encounter (HOSPITAL_COMMUNITY): Payer: Self-pay | Admitting: *Deleted

## 2015-04-18 DIAGNOSIS — R51 Headache: Secondary | ICD-10-CM | POA: Diagnosis not present

## 2015-04-18 DIAGNOSIS — R1030 Lower abdominal pain, unspecified: Secondary | ICD-10-CM | POA: Insufficient documentation

## 2015-04-18 DIAGNOSIS — Z3A32 32 weeks gestation of pregnancy: Secondary | ICD-10-CM | POA: Insufficient documentation

## 2015-04-18 DIAGNOSIS — O9989 Other specified diseases and conditions complicating pregnancy, childbirth and the puerperium: Secondary | ICD-10-CM | POA: Insufficient documentation

## 2015-04-18 DIAGNOSIS — R109 Unspecified abdominal pain: Secondary | ICD-10-CM | POA: Diagnosis not present

## 2015-04-18 DIAGNOSIS — O26899 Other specified pregnancy related conditions, unspecified trimester: Secondary | ICD-10-CM

## 2015-04-18 LAB — URINALYSIS, ROUTINE W REFLEX MICROSCOPIC
Bilirubin Urine: NEGATIVE
GLUCOSE, UA: NEGATIVE mg/dL
Hgb urine dipstick: NEGATIVE
KETONES UR: NEGATIVE mg/dL
NITRITE: NEGATIVE
PH: 6.5 (ref 5.0–8.0)
Protein, ur: NEGATIVE mg/dL
Specific Gravity, Urine: 1.01 (ref 1.005–1.030)
Urobilinogen, UA: 0.2 mg/dL (ref 0.0–1.0)

## 2015-04-18 LAB — FETAL FIBRONECTIN: Fetal Fibronectin: NEGATIVE

## 2015-04-18 LAB — URINE MICROSCOPIC-ADD ON

## 2015-04-18 NOTE — Discharge Instructions (Signed)

## 2015-04-18 NOTE — MAU Provider Note (Signed)
History     CSN: [id number]  Arrival date and time: 04/18/15 1452   None     Chief Complaint  Patient presents with  . Abdominal Pain  . pelvic pressure    HPI:  Olivia Cooper is a 24 year old, G1P0, [redacted]w[redacted]d that presents today with sudden lower abdominal pain around 8:30am upon waking up and lasting 10 mins in duration She described the pain as being sharp and increasing when attempting to stand up this morning. Upon standing the sharp pain went away and she began to feel a lot of pressure in her lower abdomen, which led her to go to the restroom to urinate. When she went to lay back down in bed she experienced some tightness and pressure that comes and goes. Denies noticing any pattern regarding the onset and duration of her lower abdominal pain, other than the first episode she experienced this morning. Denies vomiting, vaginal bleeding/discharge, SOB, lightheadedness, dizziness, constipation, diarrhea, or pain with urination. Endorses nausea, and recent headaches that she has noticed in the last week occuring for a couple of minutes almost on a daily basis. Currently has a mild headache.    Past Medical History  Diagnosis Date  . Medical history non-contributory     Past Surgical History  Procedure Laterality Date  . No past surgeries      Family History  Problem Relation Age of Onset  . Diabetes Sister   . Hypertension Maternal Grandmother   . Hypertension Paternal Grandmother   . Cancer Paternal Grandmother     History  Substance Use Topics  . Smoking status: Never Smoker   . Smokeless tobacco: Not on file  . Alcohol Use: No    Allergies: No Known Allergies  Prescriptions prior to admission  Medication Sig Dispense Refill Last Dose  . Prenatal Vit-Fe Fumarate-FA (PRENATAL COMPLETE) 14-0.4 MG TABS Take 1 tablet by mouth daily. 60 each 1 Past Week at Unknown time    Review of Systems  Respiratory: Negative for shortness of breath.   Cardiovascular: Negative  for palpitations.  Gastrointestinal: Positive for nausea and abdominal pain. Negative for vomiting, diarrhea and constipation.  Genitourinary: Negative for dysuria, frequency and hematuria.  Neurological: Positive for headaches. Negative for dizziness.   Physical Exam   Blood pressure 113/61, pulse 93, temperature 98.3 F (36.8 C), temperature source Oral, resp. rate 18, last menstrual period 08/08/2014.  Physical Exam  Constitutional: She is oriented to person, place, and time. She appears well-developed. No distress.  HENT:  Head: Normocephalic and atraumatic.  Eyes: EOM are normal.  Cardiovascular: Normal rate, regular rhythm and normal heart sounds.  Exam reveals no gallop and no friction rub.   No murmur heard. Respiratory: Effort normal and breath sounds normal.  GI:  No tenderness to palpation in the RLQ, hypogastric region, LLQ.  Musculoskeletal: She exhibits no edema.  Neurological: She is alert and oriented to person, place, and time.  Skin: Skin is warm and dry.  Psychiatric: She has a normal mood and affect. Her behavior is normal. Judgment and thought content normal.    MAU Course  Procedures  MDM  Pt has a reactive strip with no contractions. She denies feeling any abdominal pain now. She had a negative FFN on 04/11/15. Her cervix is closed Assessment and Plan  A: Ms. Nero is a 24 yo G1P0, [redacted]w[redacted]d that presents today with lower abdominal pain that is intermittent with sudden onset starting this morning at 8:30am. She is currently not in pain  and other than a mild headache isn't experiencing any symptoms.  Abdominal Pain in Pregnancy  P:Follow up at regularly scheduled appt Discharge to home   [facility] AllianceClemmons,Lori Grissett 04/18/2015, 4:27 PM

## 2015-04-18 NOTE — MAU Note (Signed)
Sharp abd cramping & tightness since this morning.  C/O extreme pressure with urination, no burning.  Denies bleeding or LOF.  Dx'd with short cervix 1-2 weeks ago.

## 2015-04-20 ENCOUNTER — Encounter: Payer: Self-pay | Admitting: Family Medicine

## 2015-04-20 ENCOUNTER — Ambulatory Visit (INDEPENDENT_AMBULATORY_CARE_PROVIDER_SITE_OTHER): Payer: BLUE CROSS/BLUE SHIELD | Admitting: Family Medicine

## 2015-04-20 ENCOUNTER — Encounter: Payer: BLUE CROSS/BLUE SHIELD | Admitting: Advanced Practice Midwife

## 2015-04-20 VITALS — BP 111/67 | HR 89 | Temp 98.2°F | Wt 147.9 lb

## 2015-04-20 DIAGNOSIS — Z3492 Encounter for supervision of normal pregnancy, unspecified, second trimester: Secondary | ICD-10-CM

## 2015-04-20 NOTE — Patient Instructions (Signed)
Third Trimester of Pregnancy The third trimester is from week 29 through week 42, months 7 through 9. The third trimester is a time when the fetus is growing rapidly. At the end of the ninth month, the fetus is about 20 inches in length and weighs 6-10 pounds.  BODY CHANGES Your body goes through many changes during pregnancy. The changes vary from woman to woman.   Your weight will continue to increase. You can expect to gain 25-35 pounds (11-16 kg) by the end of the pregnancy.  You may begin to get stretch marks on your hips, abdomen, and breasts.  You may urinate more often because the fetus is moving lower into your pelvis and pressing on your bladder.  You may develop or continue to have heartburn as a result of your pregnancy.  You may develop constipation because certain hormones are causing the muscles that push waste through your intestines to slow down.  You may develop hemorrhoids or swollen, bulging veins (varicose veins).  You may have pelvic pain because of the weight gain and pregnancy hormones relaxing your joints between the bones in your pelvis. Backaches may result from overexertion of the muscles supporting your posture.  You may have changes in your hair. These can include thickening of your hair, rapid growth, and changes in texture. Some women also have hair loss during or after pregnancy, or hair that feels dry or thin. Your hair will most likely return to normal after your baby is born.  Your breasts will continue to grow and be tender. A yellow discharge may leak from your breasts called colostrum.  Your belly button may stick out.  You may feel short of breath because of your expanding uterus.  You may notice the fetus "dropping," or moving lower in your abdomen.  You may have a bloody mucus discharge. This usually occurs a few days to a week before labor begins.  Your cervix becomes thin and soft (effaced) near your due date. WHAT TO EXPECT AT YOUR PRENATAL  EXAMS  You will have prenatal exams every 2 weeks until week 36. Then, you will have weekly prenatal exams. During a routine prenatal visit:  You will be weighed to make sure you and the fetus are growing normally.  Your blood pressure is taken.  Your abdomen will be measured to track your baby's growth.  The fetal heartbeat will be listened to.  Any test results from the previous visit will be discussed.  You may have a cervical check near your due date to see if you have effaced. At around 36 weeks, your caregiver will check your cervix. At the same time, your caregiver will also perform a test on the secretions of the vaginal tissue. This test is to determine if a type of bacteria, Group B streptococcus, is present. Your caregiver will explain this further. Your caregiver may ask you:  What your birth plan is.  How you are feeling.  If you are feeling the baby move.  If you have had any abnormal symptoms, such as leaking fluid, bleeding, severe headaches, or abdominal cramping.  If you have any questions. Other tests or screenings that may be performed during your third trimester include:  Blood tests that check for low iron levels (anemia).  Fetal testing to check the health, activity level, and growth of the fetus. Testing is done if you have certain medical conditions or if there are problems during the pregnancy. FALSE LABOR You may feel small, irregular contractions that   eventually go away. These are called Braxton Hicks contractions, or false labor. Contractions may last for hours, days, or even weeks before true labor sets in. If contractions come at regular intervals, intensify, or become painful, it is best to be seen by your caregiver.  SIGNS OF LABOR   Menstrual-like cramps.  Contractions that are 5 minutes apart or less.  Contractions that start on the top of the uterus and spread down to the lower abdomen and back.  A sense of increased pelvic pressure or back  pain.  A watery or bloody mucus discharge that comes from the vagina. If you have any of these signs before the 37th week of pregnancy, call your caregiver right away. You need to go to the hospital to get checked immediately. HOME CARE INSTRUCTIONS   Avoid all smoking, herbs, alcohol, and unprescribed drugs. These chemicals affect the formation and growth of the baby.  Follow your caregiver's instructions regarding medicine use. There are medicines that are either safe or unsafe to take during pregnancy.  Exercise only as directed by your caregiver. Experiencing uterine cramps is a good sign to stop exercising.  Continue to eat regular, healthy meals.  Wear a good support bra for breast tenderness.  Do not use hot tubs, steam rooms, or saunas.  Wear your seat belt at all times when driving.  Avoid raw meat, uncooked cheese, cat litter boxes, and soil used by cats. These carry germs that can cause birth defects in the baby.  Take your prenatal vitamins.  Try taking a stool softener (if your caregiver approves) if you develop constipation. Eat more high-fiber foods, such as fresh vegetables or fruit and whole grains. Drink plenty of fluids to keep your urine clear or pale yellow.  Take warm sitz baths to soothe any pain or discomfort caused by hemorrhoids. Use hemorrhoid cream if your caregiver approves.  If you develop varicose veins, wear support hose. Elevate your feet for 15 minutes, 3-4 times a day. Limit salt in your diet.  Avoid heavy lifting, wear low heal shoes, and practice good posture.  Rest a lot with your legs elevated if you have leg cramps or low back pain.  Visit your dentist if you have not gone during your pregnancy. Use a soft toothbrush to brush your teeth and be gentle when you floss.  A sexual relationship may be continued unless your caregiver directs you otherwise.  Do not travel far distances unless it is absolutely necessary and only with the approval  of your caregiver.  Take prenatal classes to understand, practice, and ask questions about the labor and delivery.  Make a trial run to the hospital.  Pack your hospital bag.  Prepare the baby's nursery.  Continue to go to all your prenatal visits as directed by your caregiver. SEEK MEDICAL CARE IF:  You are unsure if you are in labor or if your water has broken.  You have dizziness.  You have mild pelvic cramps, pelvic pressure, or nagging pain in your abdominal area.  You have persistent nausea, vomiting, or diarrhea.  You have a bad smelling vaginal discharge.  You have pain with urination. SEEK IMMEDIATE MEDICAL CARE IF:   You have a fever.  You are leaking fluid from your vagina.  You have spotting or bleeding from your vagina.  You have severe abdominal cramping or pain.  You have rapid weight loss or gain.  You have shortness of breath with chest pain.  You notice sudden or extreme swelling   of your face, hands, ankles, feet, or legs.  You have not felt your baby move in over an hour.  You have severe headaches that do not go away with medicine.  You have vision changes. Document Released: 09/18/2001 Document Revised: 09/29/2013 Document Reviewed: 11/25/2012 [employer] Patient Information 2015 [employer]. This information is not intended to replace advice given to you by your health care provider. Make sure you discuss any questions you have with your health care provider.  

## 2015-04-20 NOTE — Progress Notes (Signed)
Subjective:  Olivia GoldmannDarrian Cooper is a 24 y.o. G1P0 at 6118w6d being seen today for ongoing prenatal care.  Patient reports no complaints.  Contractions: Not present.  Vag. Bleeding: None. Movement: Present. Denies leaking of fluid.   The following portions of the patient's history were reviewed and updated as appropriate: allergies, current medications, past family history, past medical history, past social history, past surgical history and problem list.   Objective:   Filed Vitals:   04/20/15 1606  BP: 111/67  Pulse: 89  Temp: 98.2 F (36.8 C)  Weight: 147 lb 14.4 oz (67.087 kg)    Fetal Status: Fetal Heart Rate (bpm): 145 Fundal Height: 32 cm Movement: Present     General:  Alert, oriented and cooperative. Patient is in no acute distress.  Skin: Skin is warm and dry. No rash noted.   Cardiovascular: Normal heart rate noted  Respiratory: Normal respiratory effort, no problems with respiration noted  Abdomen: Soft, gravid, appropriate for gestational age. Pain/Pressure: Absent     Vaginal: Vag. Bleeding: None.       Cervix: Not evaluated        Extremities: Normal range of motion.  Edema: Trace  Mental Status: Normal mood and affect. Normal behavior. Normal judgment and thought content.   Urinalysis:      Assessment and Plan:  Pregnancy: G1P0 at 518w6d  1. Supervision of normal pregnancy, second trimester FHT normal, fundal heigh normal   Preterm labor symptoms and general obstetric precautions including but not limited to vaginal bleeding, contractions, leaking of fluid and fetal movement were reviewed in detail with the patient.  Please refer to After Visit Summary for other counseling recommendations.   Return in about 2 weeks (around 05/04/2015).   Levie HeritageJacob J Stinson, DO

## 2015-04-25 ENCOUNTER — Telehealth: Payer: Self-pay | Admitting: *Deleted

## 2015-04-25 NOTE — Telephone Encounter (Addendum)
Patient called and stated that she is planning to go to school. Her school needs documentation of her edc and restrictions. Patient would like call back to discuss this.   7/20  1510  Called pt and left message stating that I am calling in reference to her message from 7/18. Please call back and state whether we may leave a detailed message on her voice mail.  **Pt needs to be informed that she can pick up a letter to give to her school with the requested information. Letter has been placed in her chart and can be printed upon her arrival.  Sedalia MutaDiane Day [facility] 7/21  [phone]  Spoke w/pt and advised her that a letter has been composed for her to give to her school. She can pick it up at the front office today. Pt expressed gratitude and stated that she will obtain the letter today. She also stated that she is having "real bad Braxton-Hicks contractions today which are making me feel nauseous". She requested a doctor's note because she is not going to work today due to the discomfort. I explained that we cannot give her a note/excuse from work or school unless she is seen and evaluated for her problem. I also stated that what she is feeling is not uncommon and is not labor if it is not progressing. Therefore, she does not require evaluation @ [facility]U. She then asked if it is normal for her to feel nauseous with the contractions and I said yes that is possible. Pt voiced understanding of all information given.

## 2015-04-26 ENCOUNTER — Encounter: Payer: Self-pay | Admitting: Obstetrics & Gynecology

## 2015-04-27 ENCOUNTER — Encounter: Payer: Self-pay | Admitting: *Deleted

## 2015-04-28 ENCOUNTER — Encounter: Payer: Self-pay | Admitting: *Deleted

## 2015-05-03 ENCOUNTER — Encounter (HOSPITAL_COMMUNITY): Payer: Self-pay | Admitting: *Deleted

## 2015-05-03 ENCOUNTER — Inpatient Hospital Stay (HOSPITAL_COMMUNITY)
Admission: AD | Admit: 2015-05-03 | Discharge: 2015-05-03 | Disposition: A | Discharge status: Home / Self Care | Payer: BLUE CROSS/BLUE SHIELD | Source: Ambulatory Visit | Attending: Obstetrics and Gynecology | Admitting: Obstetrics and Gynecology

## 2015-05-03 DIAGNOSIS — O1203 Gestational edema, third trimester: Secondary | ICD-10-CM | POA: Insufficient documentation

## 2015-05-03 DIAGNOSIS — R519 Headache, unspecified: Secondary | ICD-10-CM

## 2015-05-03 DIAGNOSIS — O26893 Other specified pregnancy related conditions, third trimester: Secondary | ICD-10-CM

## 2015-05-03 DIAGNOSIS — R600 Localized edema: Secondary | ICD-10-CM

## 2015-05-03 DIAGNOSIS — Z3A34 34 weeks gestation of pregnancy: Secondary | ICD-10-CM | POA: Insufficient documentation

## 2015-05-03 DIAGNOSIS — R51 Headache: Secondary | ICD-10-CM | POA: Diagnosis not present

## 2015-05-03 LAB — URINALYSIS, ROUTINE W REFLEX MICROSCOPIC
BILIRUBIN URINE: NEGATIVE
GLUCOSE, UA: NEGATIVE mg/dL
HGB URINE DIPSTICK: NEGATIVE
Ketones, ur: NEGATIVE mg/dL
NITRITE: NEGATIVE
Protein, ur: NEGATIVE mg/dL
Specific Gravity, Urine: 1.015 (ref 1.005–1.030)
UROBILINOGEN UA: 0.2 mg/dL (ref 0.0–1.0)
pH: 6 (ref 5.0–8.0)

## 2015-05-03 LAB — URINE MICROSCOPIC-ADD ON

## 2015-05-03 NOTE — Discharge Instructions (Signed)
Drink plenty of water, keep your feet elevated, and consider wearing compression stockings to help with swelling.

## 2015-05-03 NOTE — MAU Note (Signed)
Pt presents with c/o headache, bilateral swelling of feet. Denies dizziness. Denies upper abdominal pain. Positive fetal movement. Denies UC's today. Denies n/v. Says she may have headache because she needs to eat. Denies any other abnormal feelings or symptoms.

## 2015-05-03 NOTE — MAU Provider Note (Signed)
History     CSN: [id number]  Arrival date and time: 05/03/15 2029   None     Chief Complaint  Patient presents with  . Foot Swelling  . Headache   HPI  Patient is 24 y.o. G1P0 [redacted]w[redacted]d here with complaints of lower extremity edema, headache, and concerned about blood pressure.  Edema: Patient has had swelling in lower extremities throughout her pregnancy, but she has noticed an increase in swelling x36 hours. Denies tenderness or pain in the LE. States that she has not been walking or standing more than usual. Denies erythema or warmth in her feet.   Headache: "slight" headache for the past few hours. Did not take anything for HA. Rates 2/10.   BP: No elevated BP during her pregnancy thus far. Denies changes in vision.   +FM, denies LOF, VB, contractions, vaginal discharge.     OB History    Gravida Para Term Preterm AB TAB SAB Ectopic Multiple Living   1               Past Medical History  Diagnosis Date  . Medical history non-contributory     Past Surgical History  Procedure Laterality Date  . No past surgeries      Family History  Problem Relation Age of Onset  . Diabetes Sister   . Hypertension Maternal Grandmother   . Hypertension Paternal Grandmother   . Cancer Paternal Grandmother     History  Substance Use Topics  . Smoking status: Never Smoker   . Smokeless tobacco: Never Used  . Alcohol Use: No    Allergies: No Known Allergies  Prescriptions prior to admission  Medication Sig Dispense Refill Last Dose  . Prenatal Vit-Fe Fumarate-FA (PRENATAL COMPLETE) 14-0.4 MG TABS Take 1 tablet by mouth daily. 60 each 1 Past Week at Unknown time    Review of Systems  Constitutional: Negative for fever, chills and malaise/fatigue.  HENT: Negative for congestion.   Eyes: Negative for blurred vision, double vision and photophobia.  Respiratory: Negative for cough and shortness of breath.   Cardiovascular: Negative for chest pain, palpitations, claudication  and leg swelling.  Gastrointestinal: Negative for heartburn, nausea, vomiting, abdominal pain, diarrhea and constipation.  Genitourinary: Negative for dysuria and hematuria.  Musculoskeletal: Negative for myalgias and back pain.  Skin: Negative for itching and rash.  Neurological: Negative for dizziness, loss of consciousness and headaches.   Physical Exam   Blood pressure 111/74, pulse 80, height  (1.676 m), weight 70.943 kg (156 lb 6.4 oz), last menstrual period 08/08/2014.  Physical Exam  Constitutional: She is oriented to person, place, and time. She appears well-developed and well-nourished. No distress.  HENT:  Head: Normocephalic and atraumatic.  Eyes: Conjunctivae and EOM are normal.  Neck: Normal range of motion. No thyromegaly present.  Cardiovascular: Normal rate, regular rhythm and normal heart sounds.  Exam reveals no gallop and no friction rub.   No murmur heard. Pedal pulses intact and equal  Respiratory: Breath sounds normal. No respiratory distress. She has no wheezes. She has no rales.  GI: Soft. Bowel sounds are normal. She exhibits no distension. There is no tenderness.  Musculoskeletal: Normal range of motion. She exhibits edema (1-2+ non-pitting pedal edema bilat). She exhibits no tenderness.  Neurological: She is alert and oriented to person, place, and time. She displays normal reflexes.  Skin: Skin is warm and dry. No rash noted. No erythema.  Psychiatric: She has a normal mood and affect. Her behavior is normal.  NST reactive, no decels  Toco: Mild uterine irritability   MAU Course  Procedures  MDM Sequential BPs obtained. Fetal monitoring performed. UA collected.   Assessment and Plan  Patient is 24 y.o. G1P0 [redacted]w[redacted]d reporting pedal edema and mild headache - fetal kick counts reinforced - preterm labor precautions -patient declines medication for HA  -provided reassurance that patient does not have any signs of preE due to normal BP and lack of  protein on UA today, as well as normal BP throughout prenatal visits  -counseled pt that some swelling in pregnancy is normal, and that she should drink plenty of water, keep her feet elevated, and consider wearing compression stockings -stable for discharge to home   De Hollingshead 05/03/2015, 9:13 PM   Seen and examined by me Agree with note There is 1+2+ pedal edema DTRs normal with no clonus Mild headache FHR reassuring BPs low/normal NO evidence of preeclampsia Discussed findings with patient Advised elevate feet and drink liberally Aviva Signs, CNM

## 2015-05-03 NOTE — Plan of Care (Signed)
Pt. Urine in lab 

## 2015-05-05 ENCOUNTER — Inpatient Hospital Stay (HOSPITAL_COMMUNITY)
Admission: AD | Admit: 2015-05-05 | Discharge: 2015-05-05 | Disposition: A | Discharge status: Home / Self Care | Payer: BLUE CROSS/BLUE SHIELD | Source: Ambulatory Visit | Attending: Obstetrics & Gynecology | Admitting: Obstetrics & Gynecology

## 2015-05-05 ENCOUNTER — Encounter (HOSPITAL_COMMUNITY): Payer: Self-pay | Admitting: *Deleted

## 2015-05-05 DIAGNOSIS — O4703 False labor before 37 completed weeks of gestation, third trimester: Secondary | ICD-10-CM

## 2015-05-05 DIAGNOSIS — Z3A35 35 weeks gestation of pregnancy: Secondary | ICD-10-CM

## 2015-05-05 LAB — URINALYSIS, ROUTINE W REFLEX MICROSCOPIC
BILIRUBIN URINE: NEGATIVE
Glucose, UA: NEGATIVE mg/dL
Hgb urine dipstick: NEGATIVE
Ketones, ur: NEGATIVE mg/dL
Nitrite: NEGATIVE
Protein, ur: NEGATIVE mg/dL
SPECIFIC GRAVITY, URINE: 1.01 (ref 1.005–1.030)
Urobilinogen, UA: 0.2 mg/dL (ref 0.0–1.0)
pH: 6 (ref 5.0–8.0)

## 2015-05-05 LAB — WET PREP, GENITAL
Clue Cells Wet Prep HPF POC: NONE SEEN
TRICH WET PREP: NONE SEEN
YEAST WET PREP: NONE SEEN

## 2015-05-05 LAB — URINE MICROSCOPIC-ADD ON

## 2015-05-05 NOTE — Discharge Instructions (Signed)

## 2015-05-05 NOTE — MAU Note (Signed)
Urine in lab 

## 2015-05-05 NOTE — MAU Provider Note (Signed)
History     CSN: [id number]  Arrival date and time: 05/05/15 1207    First Provider Initiated Contact with Patient 05/05/2015 at 1345.  CC: Contractions.  HPI Comments: Pt is a 24 yo f G1P0 a [redacted]w[redacted]d who presents w/ complaints of contractions q3-5 min which began this morning at 1100. She has had no VB, but complains of a moderate amount of vag d/c that she noticed began about 2 wks ago. She also states she has had some increased freq of urination, but denies dysuria. She has received betamethasone x2 for contractions approx 1 mo ago. She is also having sig leg swelling, worse when sitting in a chair all day.   OB History    Gravida Para Term Preterm AB TAB SAB Ectopic Multiple Living   1               Past Medical History  Diagnosis Date  . Medical history non-contributory     Past Surgical History  Procedure Laterality Date  . No past surgeries      Family History  Problem Relation Age of Onset  . Diabetes Sister   . Hypertension Maternal Grandmother   . Hypertension Paternal Grandmother   . Cancer Paternal Grandmother     History  Substance Use Topics  . Smoking status: Never Smoker   . Smokeless tobacco: Never Used  . Alcohol Use: No    Allergies: No Known Allergies  Prescriptions prior to admission  Medication Sig Dispense Refill Last Dose  . Prenatal Vit-Fe Fumarate-FA (PRENATAL COMPLETE) 14-0.4 MG TABS Take 1 tablet by mouth daily. 60 each 1 05/04/2015 at Unknown time    Review of Systems  Constitutional: Negative for fever.  Respiratory: Negative for shortness of breath.   Cardiovascular: Positive for leg swelling. Negative for chest pain.  Gastrointestinal: Negative for heartburn, nausea, vomiting, abdominal pain, diarrhea and constipation.  Genitourinary: Negative for dysuria, frequency and hematuria.  Neurological: Negative for weakness.   Physical Exam   Blood pressure 112/76, pulse 93, temperature 98.5 F (36.9 C), temperature source Oral, resp.  rate 20, height 5' 4.5" (1.638 m), weight 70.761 kg (156 lb), last menstrual period 08/08/2014.  Physical Exam  Constitutional: She is oriented to person, place, and time. She appears well-developed and well-nourished. No distress.  HENT:  Head: Normocephalic and atraumatic.  Eyes: Conjunctivae and EOM are normal.  Neck: Normal range of motion.  Cardiovascular: Intact distal pulses.   Respiratory: Effort normal. No respiratory distress.  GI: Soft. She exhibits no distension. There is no tenderness.  Genitourinary: Uterus normal. There is no rash or lesion on the right labia. There is no rash or lesion on the left labia. No erythema or bleeding in the vagina. Vaginal discharge (mucus) found.  Musculoskeletal: Normal range of motion. She exhibits edema (bilat 1+ pedal edema). She exhibits no tenderness.  Neurological: She is alert and oriented to person, place, and time.  Skin: She is not diaphoretic.  Dilation: 1 Effacement (%): 50 Cervical Position: Posterior Station: -3 Presentation: Vertex Exam by:: Dorathy Kinsman CNM   EFM: 145, moderate variability, 15x15 accels, no decels TOCO: 1 UC, w/ UI  Results for orders placed or performed during the hospital encounter of 05/05/15 (from the past 24 hour(s))  Urinalysis, Routine w reflex microscopic (not at [facility])     Status: Abnormal   Collection Time: 05/05/15 12:20 PM  Result Value Ref Range   Color, Urine YELLOW YELLOW   APPearance CLEAR CLEAR   Specific Gravity,  Urine 1.010 1.005 - 1.030   pH 6.0 5.0 - 8.0   Glucose, UA NEGATIVE NEGATIVE mg/dL   Hgb urine dipstick NEGATIVE NEGATIVE   Bilirubin Urine NEGATIVE NEGATIVE   Ketones, ur NEGATIVE NEGATIVE mg/dL   Protein, ur NEGATIVE NEGATIVE mg/dL   Urobilinogen, UA 0.2 0.0 - 1.0 mg/dL   Nitrite NEGATIVE NEGATIVE   Leukocytes, UA SMALL (A) NEGATIVE  Urine microscopic-add on     Status: Abnormal   Collection Time: 05/05/15 12:20 PM  Result Value Ref Range   Squamous Epithelial / LPF  FEW (A) RARE   WBC, UA 3-6 <3 WBC/hpf   RBC / HPF 0-2 <3 RBC/hpf   Bacteria, UA MANY (A) RARE  Wet prep, genital     Status: Abnormal   Collection Time: 05/05/15  1:00 PM  Result Value Ref Range   Yeast Wet Prep HPF POC NONE SEEN NONE SEEN   Trich, Wet Prep NONE SEEN NONE SEEN   Clue Cells Wet Prep HPF POC NONE SEEN NONE SEEN   WBC, Wet Prep HPF POC MODERATE (A) NONE SEEN   MAU Course  Procedures  MDM Cervical exam (done by [place]), UA, Wet prep, Push fluids.  Assessment and Plan  Pt is a 24 yo f G1P0 at [redacted]w[redacted]d who presents to the MAU w/ complaints of contractions since 1100 this morning. She has not had any large contractions on the monitor since arrival, though there is some uterine irritability. Fetal HR tracing is reactive.   Contractions #it does not appear that the pt is having labor contractions at this time #counselled on Braxton-Hicks and the need to stay hydrated  Urinary freq #no other sign of UTI, but will order UA #UA neg  Vag d/c #likely physiologic per HPI, will order wet prep to r/o VB #wet prep neg  Pedal Edema #Normal in pregnancy. No concern for DVT or Pre-E  Pt d/c home w/ precautions for when to return to MAU/ED   Lowanda Foster 05/05/2015, 1:48 PM   I performed the exam and agree with above.  [place] 05/05/2015 8:04 PM

## 2015-05-05 NOTE — MAU Note (Signed)
Was having contractions, every 3 min.  Noted  Thick mucous d/c when wiped after using restroom. Was 2 cm when last checked.   Has received Betamethasone.

## 2015-05-06 ENCOUNTER — Ambulatory Visit (INDEPENDENT_AMBULATORY_CARE_PROVIDER_SITE_OTHER): Payer: Medicaid Other | Admitting: Family

## 2015-05-06 ENCOUNTER — Encounter: Payer: Self-pay | Admitting: Obstetrics & Gynecology

## 2015-05-06 VITALS — BP 115/71 | HR 80 | Temp 98.5°F | Wt 153.6 lb

## 2015-05-06 DIAGNOSIS — Z3492 Encounter for supervision of normal pregnancy, unspecified, second trimester: Secondary | ICD-10-CM

## 2015-05-06 DIAGNOSIS — R87610 Atypical squamous cells of undetermined significance on cytologic smear of cervix (ASC-US): Secondary | ICD-10-CM | POA: Diagnosis not present

## 2015-05-06 DIAGNOSIS — R87810 Cervical high risk human papillomavirus (HPV) DNA test positive: Secondary | ICD-10-CM | POA: Diagnosis not present

## 2015-05-06 LAB — POCT URINALYSIS DIP (DEVICE)
BILIRUBIN URINE: NEGATIVE
Glucose, UA: NEGATIVE mg/dL
HGB URINE DIPSTICK: NEGATIVE
KETONES UR: NEGATIVE mg/dL
NITRITE: NEGATIVE
Protein, ur: NEGATIVE mg/dL
Specific Gravity, Urine: 1.02 (ref 1.005–1.030)
UROBILINOGEN UA: 0.2 mg/dL (ref 0.0–1.0)
pH: 7 (ref 5.0–8.0)

## 2015-05-06 NOTE — Progress Notes (Signed)
Subjective:  Olivia Cooper is a 24 y.o. G1P0 at [redacted]w[redacted]d being seen today for ongoing prenatal care.  Patient reports occasional contractions.  Contractions: Not present.  Vag. Bleeding: None. Movement: Present. Denies leaking of fluid.   The following portions of the patient's history were reviewed and updated as appropriate: allergies, current medications, past family history, past medical history, past social history, past surgical history and problem list.   Objective:   Filed Vitals:   05/06/15 1055  BP: 115/71  Pulse: 80  Temp: 98.5 F (36.9 C)  Weight: 153 lb 9 oz (69.655 kg)    Fetal Status: Fetal Heart Rate (bpm): 136 Fundal Height: 35 cm Movement: Present     General:  Alert, oriented and cooperative. Patient is in no acute distress.  Skin: Skin is warm and dry. No rash noted.   Cardiovascular: Normal heart rate noted  Respiratory: Normal respiratory effort, no problems with respiration noted  Abdomen: Soft, gravid, appropriate for gestational age. Pain/Pressure: Present     Vaginal: Vag. Bleeding: None.       Cervix: Not evaluated        Extremities: Normal range of motion.  Edema: Trace  Mental Status: Normal mood and affect. Normal behavior. Normal judgment and thought content.   Urinalysis: Urine Protein: Negative Urine Glucose: Negative  Assessment and Plan:  Pregnancy: G1P0 at [redacted]w[redacted]d  1. Supervision of normal pregnancy, second trimester - Reviewed signs of labor with visuals of effacement and dilation; explained mucus plug  2. ASCUS with positive high risk HPV cervical - Colpo wnl at 16 wks  Preterm labor symptoms and general obstetric precautions including but not limited to vaginal bleeding, contractions, leaking of fluid and fetal movement were reviewed in detail with the patient. Please refer to After Visit Summary for other counseling recommendations.  Return in about 1 week (around 05/13/2015).   Eino Farber Kennith Gain, CNM

## 2015-05-06 NOTE — Progress Notes (Signed)
Breastfeeding tip of the week reviewed Leukocytes: small 

## 2015-05-16 ENCOUNTER — Telehealth: Payer: Self-pay | Admitting: *Deleted

## 2015-05-16 NOTE — Telephone Encounter (Signed)
Pt contacted clinic and request call back from nurse.  Attempted to contact patient, no answer, left message for patient to return call to the clinic.

## 2015-05-17 NOTE — Telephone Encounter (Signed)
Called patient and she states she was calling because she is about [redacted] weeks pregnant and is tired and having a lot of swelling in her feet. Patient states walking and standing is very uncomfortable and would like a note to be out of work. Told patient we are unable to do that without a medically indicated reason and that would not qualify. Told patient she is more than welcome to discuss that with her provider at her appt on Friday but usually that is not something we are able to do. Patient verbalized understanding and had no questions

## 2015-05-20 ENCOUNTER — Encounter: Payer: Self-pay | Admitting: Family Medicine

## 2015-05-20 ENCOUNTER — Ambulatory Visit (INDEPENDENT_AMBULATORY_CARE_PROVIDER_SITE_OTHER): Payer: Medicaid Other | Admitting: Family Medicine

## 2015-05-20 VITALS — BP 115/73 | HR 93 | Temp 98.6°F | Wt 151.6 lb

## 2015-05-20 DIAGNOSIS — Z3493 Encounter for supervision of normal pregnancy, unspecified, third trimester: Secondary | ICD-10-CM

## 2015-05-20 LAB — OB RESULTS CONSOLE GBS: STREP GROUP B AG: NEGATIVE

## 2015-05-20 NOTE — Progress Notes (Signed)
Subjective:  Olivia Cooper is a 24 y.o. G1P0 at [redacted]w[redacted]d being seen today for ongoing prenatal care.  Patient reports no complaints.  Contractions: Irregular.  Vag. Bleeding: None. Movement: Present. Denies leaking of fluid.   The following portions of the patient's history were reviewed and updated as appropriate: allergies, current medications, past family history, past medical history, past social history, past surgical history and problem list.   Objective:   Filed Vitals:   05/20/15 1009  BP: 115/73  Pulse: 93  Temp: 98.6 F (37 C)  Weight: 151 lb 9.6 oz (68.765 kg)    Fetal Status: Fetal Heart Rate (bpm): 141   Movement: Present     General:  Alert, oriented and cooperative. Patient is in no acute distress.  Skin: Skin is warm and dry. No rash noted.   Cardiovascular: Normal heart rate noted  Respiratory: Normal respiratory effort, no problems with respiration noted  Abdomen: Soft, gravid, appropriate for gestational age. Pain/Pressure: Present     Pelvic: Vag. Bleeding: None     Cervical exam performed Dilation: 1.5 Effacement (%): 60 Station: -2  Extremities: Normal range of motion.     Mental Status: Normal mood and affect. Normal behavior. Normal judgment and thought content.   Urinalysis:      Assessment and Plan:  Pregnancy: G1P0 at [redacted]w[redacted]d  1. Supervision of normal pregnancy, third trimester Normal FHT - Culture, beta strep (group b only)  Term labor symptoms and general obstetric precautions including but not limited to vaginal bleeding, contractions, leaking of fluid and fetal movement were reviewed in detail with the patient. Please refer to After Visit Summary for other counseling recommendations.  Return in about 1 week (around 05/27/2015).   Levie Heritage, DO

## 2015-05-20 NOTE — Patient Instructions (Signed)
Third Trimester of Pregnancy The third trimester is from week 29 through week 42, months 7 through 9. The third trimester is a time when the fetus is growing rapidly. At the end of the ninth month, the fetus is about 20 inches in length and weighs 6-10 pounds.  BODY CHANGES Your body goes through many changes during pregnancy. The changes vary from woman to woman.   Your weight will continue to increase. You can expect to gain 25-35 pounds (11-16 kg) by the end of the pregnancy.  You may begin to get stretch marks on your hips, abdomen, and breasts.  You may urinate more often because the fetus is moving lower into your pelvis and pressing on your bladder.  You may develop or continue to have heartburn as a result of your pregnancy.  You may develop constipation because certain hormones are causing the muscles that push waste through your intestines to slow down.  You may develop hemorrhoids or swollen, bulging veins (varicose veins).  You may have pelvic pain because of the weight gain and pregnancy hormones relaxing your joints between the bones in your pelvis. Backaches may result from overexertion of the muscles supporting your posture.  You may have changes in your hair. These can include thickening of your hair, rapid growth, and changes in texture. Some women also have hair loss during or after pregnancy, or hair that feels dry or thin. Your hair will most likely return to normal after your baby is born.  Your breasts will continue to grow and be tender. A yellow discharge may leak from your breasts called colostrum.  Your belly button may stick out.  You may feel short of breath because of your expanding uterus.  You may notice the fetus "dropping," or moving lower in your abdomen.  You may have a bloody mucus discharge. This usually occurs a few days to a week before labor begins.  Your cervix becomes thin and soft (effaced) near your due date. WHAT TO EXPECT AT YOUR PRENATAL  EXAMS  You will have prenatal exams every 2 weeks until week 36. Then, you will have weekly prenatal exams. During a routine prenatal visit:  You will be weighed to make sure you and the fetus are growing normally.  Your blood pressure is taken.  Your abdomen will be measured to track your baby's growth.  The fetal heartbeat will be listened to.  Any test results from the previous visit will be discussed.  You may have a cervical check near your due date to see if you have effaced. At around 36 weeks, your caregiver will check your cervix. At the same time, your caregiver will also perform a test on the secretions of the vaginal tissue. This test is to determine if a type of bacteria, Group B streptococcus, is present. Your caregiver will explain this further. Your caregiver may ask you:  What your birth plan is.  How you are feeling.  If you are feeling the baby move.  If you have had any abnormal symptoms, such as leaking fluid, bleeding, severe headaches, or abdominal cramping.  If you have any questions. Other tests or screenings that may be performed during your third trimester include:  Blood tests that check for low iron levels (anemia).  Fetal testing to check the health, activity level, and growth of the fetus. Testing is done if you have certain medical conditions or if there are problems during the pregnancy. FALSE LABOR You may feel small, irregular contractions that   eventually go away. These are called Braxton Hicks contractions, or false labor. Contractions may last for hours, days, or even weeks before true labor sets in. If contractions come at regular intervals, intensify, or become painful, it is best to be seen by your caregiver.  SIGNS OF LABOR   Menstrual-like cramps.  Contractions that are 5 minutes apart or less.  Contractions that start on the top of the uterus and spread down to the lower abdomen and back.  A sense of increased pelvic pressure or back  pain.  A watery or bloody mucus discharge that comes from the vagina. If you have any of these signs before the 37th week of pregnancy, call your caregiver right away. You need to go to the hospital to get checked immediately. HOME CARE INSTRUCTIONS   Avoid all smoking, herbs, alcohol, and unprescribed drugs. These chemicals affect the formation and growth of the baby.  Follow your caregiver's instructions regarding medicine use. There are medicines that are either safe or unsafe to take during pregnancy.  Exercise only as directed by your caregiver. Experiencing uterine cramps is a good sign to stop exercising.  Continue to eat regular, healthy meals.  Wear a good support bra for breast tenderness.  Do not use hot tubs, steam rooms, or saunas.  Wear your seat belt at all times when driving.  Avoid raw meat, uncooked cheese, cat litter boxes, and soil used by cats. These carry germs that can cause birth defects in the baby.  Take your prenatal vitamins.  Try taking a stool softener (if your caregiver approves) if you develop constipation. Eat more high-fiber foods, such as fresh vegetables or fruit and whole grains. Drink plenty of fluids to keep your urine clear or pale yellow.  Take warm sitz baths to soothe any pain or discomfort caused by hemorrhoids. Use hemorrhoid cream if your caregiver approves.  If you develop varicose veins, wear support hose. Elevate your feet for 15 minutes, 3-4 times a day. Limit salt in your diet.  Avoid heavy lifting, wear low heal shoes, and practice good posture.  Rest a lot with your legs elevated if you have leg cramps or low back pain.  Visit your dentist if you have not gone during your pregnancy. Use a soft toothbrush to brush your teeth and be gentle when you floss.  A sexual relationship may be continued unless your caregiver directs you otherwise.  Do not travel far distances unless it is absolutely necessary and only with the approval  of your caregiver.  Take prenatal classes to understand, practice, and ask questions about the labor and delivery.  Make a trial run to the hospital.  Pack your hospital bag.  Prepare the baby's nursery.  Continue to go to all your prenatal visits as directed by your caregiver. SEEK MEDICAL CARE IF:  You are unsure if you are in labor or if your water has broken.  You have dizziness.  You have mild pelvic cramps, pelvic pressure, or nagging pain in your abdominal area.  You have persistent nausea, vomiting, or diarrhea.  You have a bad smelling vaginal discharge.  You have pain with urination. SEEK IMMEDIATE MEDICAL CARE IF:   You have a fever.  You are leaking fluid from your vagina.  You have spotting or bleeding from your vagina.  You have severe abdominal cramping or pain.  You have rapid weight loss or gain.  You have shortness of breath with chest pain.  You notice sudden or extreme swelling   of your face, hands, ankles, feet, or legs.  You have not felt your baby move in over an hour.  You have severe headaches that do not go away with medicine.  You have vision changes. Document Released: 09/18/2001 Document Revised: 09/29/2013 Document Reviewed: 11/25/2012 [employer] Patient Information 2015 [employer]. This information is not intended to replace advice given to you by your health care provider. Make sure you discuss any questions you have with your health care provider.  

## 2015-05-22 LAB — CULTURE, BETA STREP (GROUP B ONLY)

## 2015-05-27 ENCOUNTER — Ambulatory Visit (INDEPENDENT_AMBULATORY_CARE_PROVIDER_SITE_OTHER): Payer: Medicaid Other | Admitting: Family

## 2015-05-27 VITALS — BP 116/82 | HR 67 | Temp 98.0°F | Wt 153.2 lb

## 2015-05-27 DIAGNOSIS — Z3493 Encounter for supervision of normal pregnancy, unspecified, third trimester: Secondary | ICD-10-CM | POA: Diagnosis present

## 2015-05-27 LAB — POCT URINALYSIS DIP (DEVICE)
BILIRUBIN URINE: NEGATIVE
Glucose, UA: NEGATIVE mg/dL
HGB URINE DIPSTICK: NEGATIVE
KETONES UR: NEGATIVE mg/dL
Nitrite: NEGATIVE
PH: 6 (ref 5.0–8.0)
Protein, ur: NEGATIVE mg/dL
SPECIFIC GRAVITY, URINE: 1.02 (ref 1.005–1.030)
Urobilinogen, UA: 0.2 mg/dL (ref 0.0–1.0)

## 2015-05-27 NOTE — Progress Notes (Signed)
Breastfeeding tip of the week reviewed, pt states she is going to bottle feed

## 2015-05-27 NOTE — Progress Notes (Signed)
States that she is having a "snotty" thick discharge. Reviewed breast feeding tip of the week with patient today.

## 2015-05-27 NOTE — Progress Notes (Signed)
Subjective:  Olivia Cooper is a 24 y.o. G1P0 at [redacted]w[redacted]d being seen today for ongoing prenatal care.  Patient reports occasional contractions.  Contractions: Irregular.  Vag. Bleeding: None. Movement: Present. Denies leaking of fluid.   The following portions of the patient's history were reviewed and updated as appropriate: allergies, current medications, past family history, past medical history, past social history, past surgical history and problem list.   Objective:   Filed Vitals:   05/27/15 1005  BP: 116/82  Pulse: 67  Temp: 98 F (36.7 C)  Weight: 153 lb 4 oz (69.514 kg)    Fetal Status: Fetal Heart Rate (bpm): 152 Fundal Height: 36 cm Movement: Present  Presentation: Vertex  General:  Alert, oriented and cooperative. Patient is in no acute distress.  Skin: Skin is warm and dry. No rash noted.   Cardiovascular: Normal heart rate noted  Respiratory: Normal respiratory effort, no problems with respiration noted  Abdomen: Soft, gravid, appropriate for gestational age. Pain/Pressure: Present     Pelvic: Vag. Bleeding: None Vag D/C Character: Other (Comment)   Cervical exam performed Dilation: 2 Effacement (%): 60 Station: -1  Extremities: Normal range of motion.  Edema: None  Mental Status: Normal mood and affect. Normal behavior. Normal judgment and thought content.   Urinalysis: Urine Protein: Negative Urine Glucose: Negative  Assessment and Plan:  Pregnancy: G1P0 at [redacted]w[redacted]d  1. Supervision of normal pregnancy, third trimester - Reviewed GBS results  Term labor symptoms and general obstetric precautions including but not limited to vaginal bleeding, contractions, leaking of fluid and fetal movement were reviewed in detail with the patient. Please refer to After Visit Summary for other counseling recommendations.   Pt given letter to excuse her from court on 06/02/15 due to late term pregnancy and distance of court.  Return in about 1 week (around 06/03/2015).   Eino Farber Kennith Gain, CNM

## 2015-05-29 ENCOUNTER — Inpatient Hospital Stay (HOSPITAL_COMMUNITY)
Admission: EM | Admit: 2015-05-29 | Discharge: 2015-05-31 | DRG: 775 | Disposition: A | Discharge status: Home / Self Care | Payer: Medicaid Other | Source: Ambulatory Visit | Attending: Obstetrics & Gynecology | Admitting: Obstetrics & Gynecology

## 2015-05-29 ENCOUNTER — Encounter (HOSPITAL_COMMUNITY): Payer: Self-pay | Admitting: *Deleted

## 2015-05-29 DIAGNOSIS — Z809 Family history of malignant neoplasm, unspecified: Secondary | ICD-10-CM | POA: Diagnosis not present

## 2015-05-29 DIAGNOSIS — Z3A38 38 weeks gestation of pregnancy: Secondary | ICD-10-CM | POA: Diagnosis present

## 2015-05-29 DIAGNOSIS — Z8249 Family history of ischemic heart disease and other diseases of the circulatory system: Secondary | ICD-10-CM

## 2015-05-29 DIAGNOSIS — Z833 Family history of diabetes mellitus: Secondary | ICD-10-CM | POA: Diagnosis not present

## 2015-05-29 DIAGNOSIS — IMO0001 Reserved for inherently not codable concepts without codable children: Secondary | ICD-10-CM

## 2015-05-29 LAB — RPR: RPR: NONREACTIVE

## 2015-05-29 LAB — CBC
HCT: 38.3 % (ref 36.0–46.0)
Hemoglobin: 12.7 g/dL (ref 12.0–15.0)
MCH: 27.2 pg (ref 26.0–34.0)
MCHC: 33.2 g/dL (ref 30.0–36.0)
MCV: 82 fL (ref 78.0–100.0)
PLATELETS: 311 10*3/uL (ref 150–400)
RBC: 4.67 MIL/uL (ref 3.87–5.11)
RDW: 14.7 % (ref 11.5–15.5)
WBC: 11.3 10*3/uL — ABNORMAL HIGH (ref 4.0–10.5)

## 2015-05-29 LAB — TYPE AND SCREEN
ABO/RH(D): O POS
Antibody Screen: NEGATIVE

## 2015-05-29 LAB — ABO/RH: ABO/RH(D): O POS

## 2015-05-29 MED ORDER — ZOLPIDEM TARTRATE 5 MG PO TABS: 5.0000 mg | ORAL_TABLET | Freq: Every evening | ORAL | Status: DC | PRN

## 2015-05-29 MED ORDER — OXYCODONE-ACETAMINOPHEN 5-325 MG PO TABS: 1.0000 | ORAL_TABLET | ORAL | Status: DC | PRN

## 2015-05-29 MED ORDER — SENNOSIDES-DOCUSATE SODIUM 8.6-50 MG PO TABS
2.0000 | ORAL_TABLET | ORAL | Status: DC
  Administered 2015-05-29 – 2015-05-31 (×2): 2 via ORAL
  Filled 2015-05-29 (×2): qty 2

## 2015-05-29 MED ORDER — BENZOCAINE-MENTHOL 20-0.5 % EX AERO
1.0000 "application " | INHALATION_SPRAY | CUTANEOUS | Status: DC | PRN
  Administered 2015-05-29: 1 via TOPICAL
  Filled 2015-05-29: qty 56

## 2015-05-29 MED ORDER — OXYCODONE-ACETAMINOPHEN 5-325 MG PO TABS: 2.0000 | ORAL_TABLET | ORAL | Status: DC | PRN

## 2015-05-29 MED ORDER — ACETAMINOPHEN 325 MG PO TABS: 650.0000 mg | ORAL_TABLET | ORAL | Status: DC | PRN

## 2015-05-29 MED ORDER — PRENATAL MULTIVITAMIN CH
1.0000 | ORAL_TABLET | Freq: Every day | ORAL | Status: DC
  Administered 2015-05-29 – 2015-05-31 (×3): 1 via ORAL
  Filled 2015-05-29 (×3): qty 1

## 2015-05-29 MED ORDER — MISOPROSTOL 200 MCG PO TABS
1000.0000 ug | ORAL_TABLET | Freq: Once | ORAL | Status: AC
  Administered 2015-05-29: 1000 ug via RECTAL

## 2015-05-29 MED ORDER — MISOPROSTOL 200 MCG PO TABS
ORAL_TABLET | ORAL | Status: AC
  Filled 2015-05-29: qty 1

## 2015-05-29 MED ORDER — IBUPROFEN 600 MG PO TABS
600.0000 mg | ORAL_TABLET | Freq: Four times a day (QID) | ORAL | Status: DC
  Administered 2015-05-29 – 2015-05-31 (×9): 600 mg via ORAL
  Filled 2015-05-29 (×9): qty 1

## 2015-05-29 MED ORDER — LIDOCAINE HCL (PF) 1 % IJ SOLN
INTRAMUSCULAR | Status: AC
  Filled 2015-05-29: qty 30

## 2015-05-29 MED ORDER — ONDANSETRON HCL 4 MG/2ML IJ SOLN: 4.0000 mg | INTRAMUSCULAR | Status: DC | PRN

## 2015-05-29 MED ORDER — TETANUS-DIPHTH-ACELL PERTUSSIS 5-2.5-18.5 LF-MCG/0.5 IM SUSP: 0.5000 mL | Freq: Once | INTRAMUSCULAR | Status: DC

## 2015-05-29 MED ORDER — SIMETHICONE 80 MG PO CHEW: 80.0000 mg | CHEWABLE_TABLET | ORAL | Status: DC | PRN

## 2015-05-29 MED ORDER — OXYCODONE-ACETAMINOPHEN 5-325 MG PO TABS
1.0000 | ORAL_TABLET | ORAL | Status: DC | PRN
  Filled 2015-05-29: qty 1

## 2015-05-29 MED ORDER — ONDANSETRON HCL 4 MG PO TABS: 4.0000 mg | ORAL_TABLET | ORAL | Status: DC | PRN

## 2015-05-29 MED ORDER — ONDANSETRON HCL 4 MG/2ML IJ SOLN: 4.0000 mg | Freq: Four times a day (QID) | INTRAMUSCULAR | Status: DC | PRN

## 2015-05-29 MED ORDER — OXYTOCIN 10 UNIT/ML IJ SOLN
10.0000 [IU] | Freq: Once | INTRAMUSCULAR | Status: AC
  Administered 2015-05-29: 10 [IU] via INTRAMUSCULAR

## 2015-05-29 MED ORDER — LIDOCAINE HCL (PF) 1 % IJ SOLN
30.0000 mL | INTRAMUSCULAR | Status: DC | PRN
  Filled 2015-05-29: qty 30

## 2015-05-29 MED ORDER — OXYTOCIN 10 UNIT/ML IJ SOLN
INTRAMUSCULAR | Status: AC
  Administered 2015-05-29: 10 [IU]
  Filled 2015-05-29: qty 1

## 2015-05-29 MED ORDER — WITCH HAZEL-GLYCERIN EX PADS: 1.0000 "application " | MEDICATED_PAD | CUTANEOUS | Status: DC | PRN

## 2015-05-29 MED ORDER — MISOPROSTOL 200 MCG PO TABS
ORAL_TABLET | ORAL | Status: AC
  Filled 2015-05-29: qty 5

## 2015-05-29 MED ORDER — LANOLIN HYDROUS EX OINT: TOPICAL_OINTMENT | CUTANEOUS | Status: DC | PRN

## 2015-05-29 MED ORDER — OXYCODONE-ACETAMINOPHEN 5-325 MG PO TABS
1.0000 | ORAL_TABLET | ORAL | Status: DC | PRN
  Administered 2015-05-29: 1 via ORAL

## 2015-05-29 MED ORDER — CITRIC ACID-SODIUM CITRATE 334-500 MG/5ML PO SOLN: 30.0000 mL | ORAL | Status: DC | PRN

## 2015-05-29 MED ORDER — DIBUCAINE 1 % RE OINT: 1.0000 "application " | TOPICAL_OINTMENT | RECTAL | Status: DC | PRN

## 2015-05-29 MED ORDER — DIPHENHYDRAMINE HCL 25 MG PO CAPS: 25.0000 mg | ORAL_CAPSULE | Freq: Four times a day (QID) | ORAL | Status: DC | PRN

## 2015-05-29 NOTE — H&P (Signed)
LABOR AND DELIVERY ADMISSION HISTORY AND PHYSICAL NOTE  Evia Goldsmith is a 24 y.o. female G1P0 with IUP at [redacted]w[redacted]d by 13-week ultrasound presenting for contractions and leakage of fluid which began approximately 3 hours prior to arrival. Positive fetal movement, no bleeding.  Prenatal History/Complications:  Past Medical History: Past Medical History  Diagnosis Date  . Medical history non-contributory     Past Surgical History: Past Surgical History  Procedure Laterality Date  . No past surgeries      Obstetrical History: OB History    Gravida Para Term Preterm AB TAB SAB Ectopic Multiple Living   1               Social History: Social History   Social History  . Marital Status: Single    Spouse Name: N/A  . Number of Children: N/A  . Years of Education: N/A   Social History Main Topics  . Smoking status: Never Smoker   . Smokeless tobacco: Never Used  . Alcohol Use: No  . Drug Use: No  . Sexual Activity: Yes    Birth Control/ Protection: None   Other Topics Concern  . None   Social History Narrative    Family History: Family History  Problem Relation Age of Onset  . Diabetes Sister   . Hypertension Maternal Grandmother   . Hypertension Paternal Grandmother   . Cancer Paternal Grandmother     Allergies: No Known Allergies  Prescriptions prior to admission  Medication Sig Dispense Refill Last Dose  . Prenatal Vit-Fe Fumarate-FA (PRENATAL COMPLETE) 14-0.4 MG TABS Take 1 tablet by mouth daily. 60 each 1 Taking     Review of Systems   All systems reviewed and negative except as stated in HPI  Blood pressure 134/83, pulse 80, last menstrual period 08/08/2014. General appearance: alert, cooperative, appears stated age and moderate distress Lungs: clear to auscultation bilaterally Heart: regular rate and rhythm Abdomen: soft, non-tender; bowel sounds normal Pelvic: complete and +2 station Extremities: no calf swelling or tenderness Presentation:  cephalic Fetal monitoring130/mod/+a/-d Uterine activity q 2-3 min  Dilation: 10 Exam by:: DR  [id number]     Prenatal labs: ABO, Rh: O/POS/-- (02/17 1414) Antibody: NEG (02/17 1414) Rubella:  immune RPR: NON REAC (06/22 1417)  HBsAg: NEGATIVE (02/17 1414)  HIV: NONREACTIVE (06/22 1417)  GBS: Negative (08/12 0000)  1 hr Glucola 123  Genetic screening  Normal first Anatomy US wnl  Prenatal Transfer Tool  Maternal Diabetes: No Genetic Screening: Normal Maternal Ultrasounds/Referrals: Normal Fetal Ultrasounds or other Referrals:  None Maternal Substance Abuse:  No Significant Maternal Medications:  None Significant Maternal Lab Results: Lab values include: Group B Strep negative  No results found for this or any previous visit (from the past 24 hour(s)).  Patient Active Problem List   Diagnosis Date Noted  . Active labor at term 05/29/2015  . Short cervical length during pregnancy 04/11/2015  . Marginal insertion of umbilical cord 01/20/2015  . ASCUS with positive high risk HPV cervical 11/29/2014  . Supervision of normal pregnancy 11/24/2014    Assessment: Makinsey Pepitone is a 24 y.o. G1P0 at [redacted]w[redacted]d here for active labor and SROM. Completely dilated.  #Labor: admit to l and d, anticipate delivery soon #FWB: Cat 1 #ID:  gbs negative #MOF: bottle #MOC: undecided, has considered OCPs #Circ:  Desires as outpatient  Anette Riedel B Nuala Chiles 05/29/2015, 4:04 AM

## 2015-05-29 NOTE — MAU Note (Signed)
PT  SAYS  SHE  STARTED  HURTING  BAD  AT 0225.Marland Kitchen  SROM  AT 0230-   VE IN  CLINIC  3-4  CM.   DENIES HSV AND  MRSA.     GBS-  NEG

## 2015-05-29 NOTE — Progress Notes (Signed)
Dr Doroteo Glassman notified of pt's admission and status. Pt going to 164 in BS. Dr Levon Hedger on unit and aware and going to [facility]

## 2015-05-29 NOTE — Progress Notes (Signed)
Report called to Verne Grain RN in Bs. Pt may come to 164.

## 2015-05-30 NOTE — Progress Notes (Signed)
Post Partum Day 1 Subjective: no complaints, up ad lib, voiding, tolerating PO and + flatus  Objective: Blood pressure 124/87, pulse 78, temperature 98.1 F (36.7 C), temperature source Oral, resp. rate 20, height  (1.676 m), weight 153 lb (69.4 kg), last menstrual period 08/08/2014, SpO2 99 %, unknown if currently breastfeeding.  Physical Exam:  General: alert, cooperative, appears stated age and no distress Lochia: appropriate Uterine Fundus: firm Incision: healing well DVT Evaluation: No evidence of DVT seen on physical exam. Negative Homan's sign. No cords or calf tenderness.   Recent Labs  05/29/15 0420  HGB 12.7  HCT 38.3    Assessment/Plan: Plan for discharge tomorrow   LOS: 1 day   Wyvonnia Dusky DARLENE 05/30/2015, 5:33 AM

## 2015-05-31 LAB — HIV ANTIBODY (ROUTINE TESTING W REFLEX): HIV SCREEN 4TH GENERATION: NONREACTIVE

## 2015-05-31 NOTE — Progress Notes (Signed)
UR chart review completed.  

## 2015-05-31 NOTE — Discharge Summary (Signed)
Obstetric Discharge Summary Reason for Admission: onset of labor Prenatal Procedures: NST and ultrasound Intrapartum Procedures: spontaneous vaginal delivery Postpartum Procedures: none Complications-Operative and Postpartum: none  At 3:18 AM a viable female was delivered via Vaginal, Spontaneous Delivery (Presentation: LOA). APGAR: 8, 9; weight pending. Placenta status: Intact, Spontaneous. Cord: 3 vessels with the following complications: None.   Anesthesia: None  Episiotomy: None Lacerations: None Est. Blood Loss (mL): 200  Upon arrival patient was complete, pushing, and head present at perineum. She had a precipitous delivery. Baby delivered without difficulty, was noted to have good tone and place on maternal abdomen for oral suctioning, drying and stimulation. Delayed cord clamping performed. Placenta delivered intact with 3V cord. Vaginal canal and perineum was inspected and hemostatic. IM Pitocin  x2 given as well as  cytotec rectally due to continued bleeding. Patient had no IV access.  Mom to postpartum. Baby to Couplet care / Skin to Skin.  Hospital Course:  Active Problems:   Active labor at term   Olivia Cooper is a 24 y.o. G1P1001 s/p NSVD.  Patient was admitted for contractions and leakage of fluid.  The patient had EBL of 500 mL throughout delivery (with 300 mL lost within 20 min after delivery). She received pitocin 10 IM x2 and cytotec 1000 mcg for this, and the bleeding resolved. She denied any symptoms of dizziness or weakness. She has postpartum course that was uncomplicated including no problems with ambulating, PO intake, urination, pain, or bleeding. The pt feels ready to go home and  will be discharged with outpatient follow-up.   Today: No acute events overnight.  Pt denies problems with ambulating, voiding or po intake.  She denies nausea or vomiting.  Pain is moderately controlled.  She has had flatus. She has not had bowel movement.  Lochia  Large.  Plan for birth control is  undecided. Patient would like to think about her options and said she would be make a decision by her OB f/u appointment. .  Method of Feeding: breast.  Physical Exam:  General: alert, cooperative and no distress Lochia: appropriate Uterine Fundus: soft DVT Evaluation: No evidence of DVT seen on physical exam.  H/H: Lab Results  Component Value Date/Time   HGB 12.7 05/29/2015 04:20 AM   HCT 38.3 05/29/2015 04:20 AM    Discharge Diagnoses: Term Pregnancy-delivered  Discharge Information: Date: 05/31/2015 Activity: pelvic rest Diet: routine  Medications: PNV, Tylenol #3 and Ibuprofen Breast feeding:  Yes Condition: stable Discharge to: home      Medication List    ASK your doctor about these medications        PRENATAL COMPLETE 14-0.4 MG Tabs  Take 1 tablet by mouth daily.         Tarri Abernethy ,MD PGY-1 [facility]   05/31/2015,10:03 AM   OB FELLOW DISCHARGE ATTESTATION  I have seen and examined this patient and agree with above documentation in the resident's note.   Olivia Cooper is a 24 y.o. G1P1001 s/p NSVD. PPH total ebl 500, received cytotec 1000 mcg rectally, asymptomatic. No anemia.  Pain is well controlled.    PE:  BP 116/70 mmHg  Pulse 58  Temp(Src) 98 F (36.7 C) (Oral)  Resp 17  Ht  (1.676 m)  Wt 153 lb (69.4 kg)  BMI 24.71 kg/m2  SpO2 98%  LMP 08/08/2014 (LMP Unknown)  Breastfeeding? Unknown Fundus firm   Recent Labs  05/29/15 0420  HGB 12.7  HCT 38.3  Plan: discharge today - postpartum care discussed - f/u clinic in 6 weeks for postpartum visit   Olivia Bilis, MD 1:44 PM

## 2015-05-31 NOTE — Discharge Instructions (Signed)

## 2015-06-02 ENCOUNTER — Encounter: Payer: Medicaid Other | Admitting: Advanced Practice Midwife

## 2015-07-04 ENCOUNTER — Encounter: Payer: Self-pay | Admitting: Medical

## 2015-07-04 ENCOUNTER — Ambulatory Visit: Payer: Medicaid Other | Admitting: Medical

## 2015-07-13 ENCOUNTER — Encounter: Payer: Self-pay | Admitting: Obstetrics & Gynecology

## 2015-07-15 ENCOUNTER — Encounter: Payer: Self-pay | Admitting: Medical

## 2015-07-15 ENCOUNTER — Ambulatory Visit (INDEPENDENT_AMBULATORY_CARE_PROVIDER_SITE_OTHER): Payer: Medicaid Other | Admitting: Medical

## 2015-07-15 MED ORDER — NORGESTIMATE-ETH ESTRADIOL 0.25-35 MG-MCG PO TABS
1.0000 | ORAL_TABLET | Freq: Every day | ORAL | Status: DC
Start: 2015-07-15 — End: 2016-09-04

## 2015-07-15 NOTE — Patient Instructions (Signed)

## 2015-07-15 NOTE — Progress Notes (Signed)
Patient ID: Olivia Cooper, female   DOB: Jan 11, 1991, 24 y.o.   MRN: [id number] Subjective:     Olivia Cooper is a 24 y.o. female who presents for a postpartum visit. She is 6 weeks postpartum following a spontaneous vaginal delivery. I have fully reviewed the prenatal and intrapartum course. The delivery was at 38.3 gestational weeks. Outcome: spontaneous vaginal delivery. Anesthesia: none. Postpartum course has been normal. Baby's course has been normal. Baby is feeding by bottle - Similac Soy. Bleeding no bleeding. Bowel function is normal. Bladder function is normal. Patient is not sexually active. Contraception method is none. Postpartum depression screening: negative.  The following portions of the patient's history were reviewed and updated as appropriate: allergies, current medications, past family history, past medical history, past social history, past surgical history and problem list.  Review of Systems Pertinent items are noted in HPI.   Objective:    BP 100/65 mmHg  Pulse 54  Temp(Src) 97.5 F (36.4 C) (Oral)  Resp 18  Ht  (1.753 m)  Wt 129 lb 12.8 oz (58.877 kg)  BMI 19.16 kg/m2  LMP 06/27/2015  Breastfeeding? No  General:  alert and cooperative   Breasts:  not performed  Lungs: clear to auscultation bilaterally  Heart:  regular rate and rhythm, S1, S2 normal, no murmur, click, rub or gallop  Abdomen: soft, non-tender; bowel sounds normal; no masses,  no organomegaly   Vulva:  not evaluated  Vagina: not evaluated  Cervix:  not evaluated  Corpus: not examined  Adnexa:  not evaluated  Rectal Exam: Not performed.        Assessment:     Normal postpartum exam. Pap smear not done at today's visit.  Patient had abnormal pap 11/24/14. Colposcopy without abnormal lesions. Plan to repeat pap smear in one year.   Plan:    1. Contraception: OCP (estrogen/progesterone) Rx sent to patient's pharmacy 2. Return to work note given 3.  Follow up in: 12/2015 for repeat  pap smear or sooner as needed.

## 2015-07-22 ENCOUNTER — Ambulatory Visit: Payer: Medicaid Other | Admitting: Medical

## 2015-09-14 ENCOUNTER — Ambulatory Visit: Payer: Medicaid Other | Admitting: Obstetrics & Gynecology

## 2015-11-29 ENCOUNTER — Encounter: Payer: Self-pay | Admitting: *Deleted

## 2016-08-29 ENCOUNTER — Encounter (HOSPITAL_COMMUNITY): Payer: Self-pay | Admitting: *Deleted

## 2016-08-29 ENCOUNTER — Emergency Department (HOSPITAL_COMMUNITY)
Admission: EM | Admit: 2016-08-29 | Discharge: 2016-08-29 | Disposition: A | Discharge status: Home / Self Care | Payer: Medicaid Other | Attending: Emergency Medicine | Admitting: Emergency Medicine

## 2016-08-29 DIAGNOSIS — Z3A01 Less than 8 weeks gestation of pregnancy: Secondary | ICD-10-CM | POA: Insufficient documentation

## 2016-08-29 DIAGNOSIS — O210 Mild hyperemesis gravidarum: Secondary | ICD-10-CM

## 2016-08-29 LAB — CBC
HEMATOCRIT: 41.3 % (ref 36.0–46.0)
HEMOGLOBIN: 13.9 g/dL (ref 12.0–15.0)
MCH: 27.9 pg (ref 26.0–34.0)
MCHC: 33.7 g/dL (ref 30.0–36.0)
MCV: 82.9 fL (ref 78.0–100.0)
Platelets: 360 10*3/uL (ref 150–400)
RBC: 4.98 MIL/uL (ref 3.87–5.11)
RDW: 13.6 % (ref 11.5–15.5)
WBC: 6.8 10*3/uL (ref 4.0–10.5)

## 2016-08-29 LAB — URINALYSIS, ROUTINE W REFLEX MICROSCOPIC
BILIRUBIN URINE: NEGATIVE
Glucose, UA: NEGATIVE mg/dL
Hgb urine dipstick: NEGATIVE
KETONES UR: 15 mg/dL — AB
NITRITE: NEGATIVE
PH: 7 (ref 5.0–8.0)
Protein, ur: 30 mg/dL — AB
SPECIFIC GRAVITY, URINE: 1.027 (ref 1.005–1.030)

## 2016-08-29 LAB — COMPREHENSIVE METABOLIC PANEL
ALK PHOS: 33 U/L — AB (ref 38–126)
ALT: 31 U/L (ref 14–54)
ANION GAP: 8 (ref 5–15)
AST: 27 U/L (ref 15–41)
Albumin: 5.4 g/dL — ABNORMAL HIGH (ref 3.5–5.0)
BILIRUBIN TOTAL: 0.4 mg/dL (ref 0.3–1.2)
BUN: 9 mg/dL (ref 6–20)
CALCIUM: 9.6 mg/dL (ref 8.9–10.3)
CO2: 23 mmol/L (ref 22–32)
Chloride: 102 mmol/L (ref 101–111)
Creatinine, Ser: 0.71 mg/dL (ref 0.44–1.00)
GFR calc Af Amer: >60 mL/min (ref >60)
GLUCOSE: 88 mg/dL (ref 65–99)
POTASSIUM: 3.6 mmol/L (ref 3.5–5.1)
Sodium: 133 mmol/L — ABNORMAL LOW (ref 135–145)
TOTAL PROTEIN: 9.1 g/dL — AB (ref 6.5–8.1)

## 2016-08-29 LAB — I-STAT BETA HCG BLOOD, ED (MC, WL, AP ONLY)

## 2016-08-29 LAB — URINE MICROSCOPIC-ADD ON

## 2016-08-29 LAB — LIPASE, BLOOD: Lipase: 18 U/L (ref 11–51)

## 2016-08-29 MED ORDER — DOXYLAMINE-PYRIDOXINE 10-10 MG PO TBEC: 1.0000 | DELAYED_RELEASE_TABLET | Freq: Two times a day (BID) | ORAL | 0 refills | Status: DC | PRN

## 2016-08-29 MED ORDER — PRENATAL COMPLETE 14-0.4 MG PO TABS: 1.0000 | ORAL_TABLET | Freq: Every day | ORAL | 0 refills | Status: DC

## 2016-08-29 MED ORDER — ONDANSETRON 4 MG PO TBDP
4.0000 mg | ORAL_TABLET | Freq: Once | ORAL | Status: AC | PRN
  Administered 2016-08-29: 4 mg via ORAL
  Filled 2016-08-29: qty 1

## 2016-08-29 MED ORDER — CEPHALEXIN 500 MG PO CAPS: 500.0000 mg | ORAL_CAPSULE | Freq: Four times a day (QID) | ORAL | 0 refills | Status: DC

## 2016-08-29 NOTE — ED Provider Notes (Signed)
[facility]-EMERGENCY DEPT Provider Note   CSN: [id number] Arrival date & time: 08/29/16  1049     History   Chief Complaint Chief Complaint  Patient presents with  . Abdominal Pain  . Emesis  . Nausea    HPI Olivia Cooper is a 25 y.o. female.  The history is provided by the patient and medical records. No language interpreter was used.    Olivia Cooper is an otherwise healthy 25 y.o. female  who presents to the Emergency Department complaining of 4 days of feeling nauseous, weak and NBNB emesis. She does not believe she is getting worse, however she is concerned because she is not getting better. She has been unable to keep anything down the last 2 days. She has taken no medications prior to arrival for symptoms. She was given Zofran in triage which did alleviate her nausea and she has been able to tolerate PO. Last menstrual period was October 13. She denies any abdominal pain. Also denies chest pain, back pain, dysuria, shortness of breath. She is having regular bowel movements with no blood noted. No vaginal discharge or bleeding including spotting.   Past Medical History:  Diagnosis Date  . Medical history non-contributory     Patient Active Problem List   Diagnosis Date Noted  . 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 NO PAST SURGERIES      OB History    Gravida Para Term Preterm AB Living   1 1 1     1    SAB TAB Ectopic Multiple Live Births         0 1       Home Medications    Prior to Admission medications   Medication Sig Start Date End Date Taking? Authorizing Provider  cephALEXin (KEFLEX) 500 MG capsule Take 1 capsule (500 mg total) by mouth 4 (four) times daily. 08/29/16   Jaime Pilcher Ward, PA-C  Doxylamine-Pyridoxine 10-10 MG TBEC Take 1 tablet by mouth 2 (two) times daily as needed. 08/29/16   Chase PicketJaime Pilcher Ward, PA-C  norgestimate-ethinyl estradiol  (ORTHO-CYCLEN,SPRINTEC,PREVIFEM) 0.25-35 MG-MCG tablet Take 1 tablet by mouth daily. Patient not taking: Reported on 08/29/2016 07/15/15   Marny LowensteinJulie N Wenzel, PA-C  Prenatal Vit-Fe Fumarate-FA (PRENATAL COMPLETE) 14-0.4 MG TABS Take 1 tablet by mouth daily. 08/29/16   Chase PicketJaime Pilcher Ward, PA-C    Family History Family History  Problem Relation Age of Onset  . Diabetes Sister   . Hypertension Maternal Grandmother   . Hypertension Paternal Grandmother   . Cancer Paternal Grandmother     Social History Social History  Substance Use Topics  . Smoking status: Never Smoker  . Smokeless tobacco: Never Used  . Alcohol use No     Allergies   Patient has no known allergies.   Review of Systems Review of Systems  Constitutional: Negative for chills and fever.  HENT: Negative for congestion.   Eyes: Negative for visual disturbance.  Respiratory: Negative for cough and shortness of breath.   Cardiovascular: Negative.   Gastrointestinal: Positive for nausea and vomiting. Negative for abdominal pain, blood in stool, constipation and diarrhea.  Genitourinary: Negative for dysuria, frequency, urgency, vaginal bleeding, vaginal discharge and vaginal pain.  Musculoskeletal: Negative for back pain.  Skin: Negative for rash.  Neurological: Negative for headaches.     Physical Exam Updated Vital Signs BP 116/95 (BP Location: Right Arm)   Pulse 82   Temp 98.4 F (  36.9 C) (Oral)   Resp 16   Ht 5\' 6"  (1.676 m)   Wt 56.7 kg   LMP 07/17/2016   SpO2 100%   BMI 20.18 kg/m   Physical Exam  Constitutional: She is oriented to person, place, and time. She appears well-developed and well-nourished. No distress.  HENT:  Head: Normocephalic and atraumatic.  Cardiovascular: Normal rate, regular rhythm and normal heart sounds.   No murmur heard. Pulmonary/Chest: Effort normal and breath sounds normal. No respiratory distress.  Abdominal: Soft. Bowel sounds are normal. She exhibits no distension.    No abdominal, flank or CVA tenderness.  Musculoskeletal: Normal range of motion.  Neurological: She is alert and oriented to person, place, and time.  Skin: Skin is warm and dry.  Nursing note and vitals reviewed.    ED Treatments / Results  Labs (all labs ordered are listed, but only abnormal results are displayed) Labs Reviewed  COMPREHENSIVE METABOLIC PANEL - Abnormal; Notable for the following:       Result Value   Sodium 133 (*)    Total Protein 9.1 (*)    Albumin 5.4 (*)    Alkaline Phosphatase 33 (*)    All other components within normal limits  URINALYSIS, ROUTINE W REFLEX MICROSCOPIC (NOT AT [facility]) - Abnormal; Notable for the following:    APPearance CLOUDY (*)    Ketones, ur 15 (*)    Protein, ur 30 (*)    Leukocytes, UA SMALL (*)    All other components within normal limits  URINE MICROSCOPIC-ADD ON - Abnormal; Notable for the following:    Squamous Epithelial / LPF 0-5 (*)    Bacteria, UA FEW (*)    All other components within normal limits  I-STAT BETA HCG BLOOD, ED ([facility], [facility], [facility] ONLY) - Abnormal; Notable for the following:    I-stat hCG, quantitative >2,000.0 (*)    All other components within normal limits  LIPASE, BLOOD  CBC    EKG  EKG Interpretation None       Radiology No results found.  Procedures Procedures (including critical care time)  Medications Ordered in ED Medications  ondansetron (ZOFRAN-ODT) disintegrating tablet 4 mg (4 mg Oral Given 08/29/16 1118)     Initial Impression / Assessment and Plan / ED Course  I have reviewed the triage vital signs and the nursing notes.  Pertinent labs & imaging results that were available during my care of the patient were reviewed by me and considered in my medical decision making (see chart for details).  Clinical Course    Olivia Cooper is a 25 y.o. female who presents to ED for nausea, vomiting and weakness for the last four days. She denies abdominal pain and has no abdominal/flank/CVA  tenderness. hcg > 2000 and patient informed of results. I again asked if she was having any abdominal pain, vaginal bleeding or discharge and she again denies. Repeat abdominal exam with no tenderness as well. Likely n/v 2/2 pregnancy. Zofran was given in triage prior to evaluation and patient states nausea has resolved. She is now able to tolerate PO without difficulty. UA does show small amount of leuks with 6-30 wbc's and 0-5 wbc's. She is not having urinary complaints but given pregnancy, will treat with keflex. Rx for diclegis and prenatal vitamin also given. Patient instructed to follow up with OBGYN and women's outpatient clinic referral information given. Reasons to return to ER were discussed and all questions answered.   Final Clinical Impressions(s) / ED Diagnoses  Final diagnoses:  Hyperemesis arising during pregnancy    New Prescriptions Discharge Medication List as of 08/29/2016  1:33 PM    START taking these medications   Details  cephALEXin (KEFLEX) 500 MG capsule Take 1 capsule (500 mg total) by mouth 4 (four) times daily., Starting Wed 08/29/2016, Print    Doxylamine-Pyridoxine 10-10 MG TBEC Take 1 tablet by mouth 2 (two) times daily as needed., Starting Wed 08/29/2016, Print         CIT GroupJaime Pilcher Ward, PA-C 08/29/16 1509    Bethann BerkshireJoseph Zammit, MD 08/30/16 1446

## 2016-08-29 NOTE — ED Triage Notes (Signed)
Pt sts n/v/ and 2 episodes of diarrhea since Sunday, reports eating out a lot this week, unable to eat or drink due to nausea

## 2016-08-29 NOTE — Progress Notes (Signed)
Pt with family planning medicaid confirms no pcp for follow up care Pt given a list of uninsured pcp in [place] for follow up care  Pt appreciative of resources offered

## 2016-08-29 NOTE — Discharge Instructions (Signed)
Please call the women's clinic on Monday morning to schedule a follow-up appointment. Please take prenatal vitamin daily. Diclegis twice a day as needed for nausea/vomiting. Please take all of your antibiotics until finished!  Return to ER for abdominal pain, vaginal bleeding, new or worsening symptoms, any additional concerns.

## 2016-08-29 NOTE — ED Notes (Signed)
Patient understood discharge instructions and follow up care.  

## 2016-09-04 ENCOUNTER — Inpatient Hospital Stay (HOSPITAL_COMMUNITY)
Admission: AD | Admit: 2016-09-04 | Discharge: 2016-09-04 | Disposition: A | Discharge status: Home / Self Care | Payer: Medicaid Other | Source: Ambulatory Visit | Attending: Obstetrics & Gynecology | Admitting: Obstetrics & Gynecology

## 2016-09-04 ENCOUNTER — Encounter (HOSPITAL_COMMUNITY): Payer: Self-pay | Admitting: *Deleted

## 2016-09-04 DIAGNOSIS — E860 Dehydration: Secondary | ICD-10-CM

## 2016-09-04 DIAGNOSIS — Z3A01 Less than 8 weeks gestation of pregnancy: Secondary | ICD-10-CM | POA: Insufficient documentation

## 2016-09-04 DIAGNOSIS — O211 Hyperemesis gravidarum with metabolic disturbance: Secondary | ICD-10-CM | POA: Insufficient documentation

## 2016-09-04 DIAGNOSIS — O219 Vomiting of pregnancy, unspecified: Secondary | ICD-10-CM

## 2016-09-04 LAB — URINALYSIS, ROUTINE W REFLEX MICROSCOPIC
Bilirubin Urine: NEGATIVE
Glucose, UA: NEGATIVE mg/dL
Hgb urine dipstick: NEGATIVE
Ketones, ur: 15 mg/dL — AB
NITRITE: NEGATIVE
PH: 8.5 — AB (ref 5.0–8.0)
Protein, ur: 30 mg/dL — AB
SPECIFIC GRAVITY, URINE: 1.02 (ref 1.005–1.030)

## 2016-09-04 LAB — URINE MICROSCOPIC-ADD ON: RBC / HPF: NONE SEEN RBC/hpf (ref 0–5)

## 2016-09-04 MED ORDER — LACTATED RINGERS IV SOLN
INTRAVENOUS | Status: DC
  Administered 2016-09-04: 999 mL/h via INTRAVENOUS

## 2016-09-04 MED ORDER — PROMETHAZINE HCL 25 MG PO TABS: 25.0000 mg | ORAL_TABLET | Freq: Four times a day (QID) | ORAL | 2 refills | Status: DC | PRN

## 2016-09-04 MED ORDER — ONDANSETRON 4 MG PO TBDP: 4.0000 mg | ORAL_TABLET | Freq: Four times a day (QID) | ORAL | 0 refills | Status: DC | PRN

## 2016-09-04 MED ORDER — PROMETHAZINE HCL 25 MG/ML IJ SOLN
12.5000 mg | Freq: Once | INTRAMUSCULAR | Status: AC
  Administered 2016-09-04: 12.5 mg via INTRAVENOUS
  Filled 2016-09-04: qty 1

## 2016-09-04 MED ORDER — DEXTROSE 5 % IN LACTATED RINGERS IV BOLUS
1000.0000 mL | Freq: Once | INTRAVENOUS | Status: AC
  Administered 2016-09-04: 1000 mL via INTRAVENOUS

## 2016-09-04 NOTE — MAU Note (Signed)
[facility], can't keep anything down.  Still unable to keep food and fluids down.  Thinks she is dehydrated. Had to call out today.feels week and shaky

## 2016-09-04 NOTE — MAU Note (Signed)
Pt presents with n/v x1 week. States that today it got worse. Unable to keep anything down. Thinks she feels dehydrated and like she is weak and going to pass out. Has RX for n/v, but states that it is not working. States that she last took it yesterday around 1900, but did not take it today. Last drank around 1700-ginger ale, but it came back up.

## 2016-09-04 NOTE — MAU Note (Signed)
Pt states that nausea is the same, however has not vomited since receiving phenergan. Able to tolerate ice chips.

## 2016-09-04 NOTE — MAU Provider Note (Signed)
Chief Complaint: Emesis During Pregnancy   First Provider Initiated Contact with Patient 09/04/16 1916        SUBJECTIVE HPI: Olivia Cooper is a 25 y.o. G2P1001 at 3947w0d by LMP who presents to maternity admissions reporting nausea and vomiting for the past few weeks. States cannot keep anything down. Feels dehydrated.  Had to call out of work. . She denies vaginal bleeding, vaginal itching/burning, urinary symptoms, h/a, dizziness, n/v, or fever/chills.   Emesis   This is a recurrent problem. The current episode started 1 to 4 weeks ago. The problem has been unchanged. There has been no fever. Associated symptoms include dizziness. Pertinent negatives include no abdominal pain, chills, coughing, diarrhea, fever, headaches, myalgias or sweats. Treatments tried: diclegis. The treatment provided no relief.   RN Note: [facility], can't keep anything down.  Still unable to keep food and fluids down.  Thinks she is dehydrated. Had to call out today.feels week and shaky  Past Medical History:  Diagnosis Date  . Medical history non-contributory    Past Surgical History:  Procedure Laterality Date  . NO PAST SURGERIES     Social History   Social History  . Marital status: Single    Spouse name: N/A  . Number of children: N/A  . Years of education: N/A   Occupational History  . Not on file.   Social History Main Topics  . Smoking status: Never Smoker  . Smokeless tobacco: Never Used  . Alcohol use No  . Drug use: No  . Sexual activity: Yes    Birth control/ protection: None   Other Topics Concern  . Not on file   Social History Narrative  . No narrative on file   No current facility-administered medications on file prior to encounter.    Current Outpatient Prescriptions on File Prior to Encounter  Medication Sig Dispense Refill  . cephALEXin (KEFLEX) 500 MG capsule Take 1 capsule (500 mg total) by mouth 4 (four) times daily. (Patient not taking: Reported on  09/04/2016) 12 capsule 0   No Known Allergies  I have reviewed patient's Past Medical Hx, Surgical Hx, Family Hx, Social Hx, medications and allergies.   ROS:  Review of Systems  Constitutional: Negative for chills and fever.  Respiratory: Negative for cough.   Gastrointestinal: Positive for vomiting. Negative for abdominal pain and diarrhea.  Musculoskeletal: Negative for myalgias.  Neurological: Positive for dizziness. Negative for headaches.    Other systems negative   Physical Exam  Physical Exam Patient Vitals for the past 24 hrs:  BP Temp Temp src Pulse Resp Height Weight  09/04/16 1737 109/63 98.2 F (36.8 C) Oral 75 16 5' 6.5" (1.689 m) 114 lb 6.4 oz (51.9 kg)   Constitutional: Well-developed, well-nourished female in no acute distress.  Cardiovascular: normal rate Respiratory: normal effort GI: Abd soft, non-tender. Pos BS x 4 MS: Extremities nontender, no edema, normal ROM Neurologic: Alert and oriented x 4.  GU: Neg CVAT.  PELVIC EXAM: Deferred   LAB RESULTS Results for orders placed or performed during the hospital encounter of 09/04/16 (from the past 24 hour(s))  Urinalysis, Routine w reflex microscopic (not [facility])     Status: Abnormal   Collection Time: 09/04/16  6:04 PM  Result Value Ref Range   Color, Urine YELLOW YELLOW   APPearance HAZY (A) CLEAR   Specific Gravity, Urine 1.020 1.005 - 1.030   pH 8.5 (H) 5.0 - 8.0   Glucose, UA NEGATIVE  NEGATIVE mg/dL   Hgb urine dipstick NEGATIVE NEGATIVE   Bilirubin Urine NEGATIVE NEGATIVE   Ketones, ur 15 (A) NEGATIVE mg/dL   Protein, ur 30 (A) NEGATIVE mg/dL   Nitrite NEGATIVE NEGATIVE   Leukocytes, UA SMALL (A) NEGATIVE  Urine microscopic-add on     Status: Abnormal   Collection Time: 09/04/16  6:04 PM  Result Value Ref Range   Squamous Epithelial / LPF 6-30 (A) NONE SEEN   WBC, UA 0-5 0 - 5 WBC/hpf   RBC / HPF NONE SEEN 0 - 5 RBC/hpf   Bacteria, UA FEW (A) NONE SEEN   Urine-Other MUCOUS PRESENT         IMAGING No results found.  MAU Management/MDM: IV fluids with Phenergan ordered Patient rehydrated until she felt better No vomiting while here Tolerated ice chips  ASSESSMENT SIUP at 4414w1d Nausea and vomiting Dehydration  PLAN Discharge home Will Rx Zofran for use during day and Phenergan for use at night Warned of possible birth defects with Zofran, patient wants to try it.    Pt stable at time of discharge. Encouraged to return here or to other Urgent Care/ED if she develops worsening of symptoms, increase in pain, fever, or other concerning symptoms.    Wynelle BourgeoisMarie Kaiden Pech CNM, MSN Certified Nurse-Midwife 09/04/2016  7:16 PM

## 2016-09-04 NOTE — Discharge Instructions (Signed)
Dehydration, Adult Dehydration is a condition in which there is not enough fluid or water in the body. This happens when you lose more fluids than you take in. Important organs, such as the kidneys, brain, and heart, cannot function without a proper amount of fluids. Any loss of fluids from the body can lead to dehydration. Dehydration can range from mild to severe. This condition should be treated right away to prevent it from becoming severe. What are the causes? This condition may be caused by:  Vomiting.  Diarrhea.  Excessive sweating, such as from heat exposure or exercise.  Not drinking enough fluid, especially:  When ill.  While doing activity that requires a lot of energy.  Excessive urination.  Fever.  Infection.  Certain medicines, such as medicines that cause the body to lose excess fluid (diuretics).  Inability to access safe drinking water.  Reduced physical ability to get adequate water and food. What increases the risk? This condition is more likely to develop in people:  Who have a poorly controlled long-term (chronic) illness, such as diabetes, heart disease, or kidney disease.  Who are age 67 or older.  Who are disabled.  Who live in a place with high altitude.  Who play endurance sports. What are the signs or symptoms? Symptoms of mild dehydration may include:  Thirst.  Dry lips.  Slightly dry mouth.  Dry, warm skin.  Dizziness. Symptoms of moderate dehydration may include:  Very dry mouth.  Muscle cramps.  Dark urine. Urine may be the color of tea.  Decreased urine production.  Decreased tear production.  Heartbeat that is irregular or faster than normal (palpitations).  Headache.  Light-headedness, especially when you stand up from a sitting position.  Fainting (syncope). Symptoms of severe dehydration may include:  Changes in skin, such as:  Cold and clammy skin.  Blotchy (mottled) or pale skin.  Skin that does not  quickly return to normal after being lightly pinched and released (poor skin turgor).  Changes in body fluids, such as:  Extreme thirst.  No tear production.  Inability to sweat when body temperature is high, such as in hot weather.  Very little urine production.  Changes in vital signs, such as:  Weak pulse.  Pulse that is more than 100 beats a minute when sitting still.  Rapid breathing.  Low blood pressure.  Other changes, such as:  Sunken eyes.  Cold hands and feet.  Confusion.  Lack of energy (lethargy).  Difficulty waking up from sleep.  Short-term weight loss.  Unconsciousness. How is this diagnosed? This condition is diagnosed based on your symptoms and a physical exam. Blood and urine tests may be done to help confirm the diagnosis. How is this treated? Treatment for this condition depends on the severity. Mild or moderate dehydration can often be treated at home. Treatment should be started right away. Do not wait until dehydration becomes severe. Severe dehydration is an emergency and it needs to be treated in a hospital. Treatment for mild dehydration may include:  Drinking more fluids.  Replacing salts and minerals in your blood (electrolytes) that you may have lost. Treatment for moderate dehydration may include:  Drinking an oral rehydration solution (ORS). This is a drink that helps you replace fluids and electrolytes (rehydrate). It can be found at pharmacies and retail stores. Treatment for severe dehydration may include:  Receiving fluids through an IV tube.  Receiving an electrolyte solution through a feeding tube that is passed through your nose and into  your stomach (nasogastric tube, or NG tube).  Correcting any abnormalities in electrolytes.  Treating the underlying cause of dehydration. Follow these instructions at home:  If directed by your health care provider, drink an ORS:  Make an ORS by following instructions on the  package.  Start by drinking small amounts, about  cup (120 mL) every 5-10 minutes.  Slowly increase how much you drink until you have taken the amount recommended by your health care provider.  Drink enough clear fluid to keep your urine clear or pale yellow. If you were told to drink an ORS, finish the ORS first, then start slowly drinking other clear fluids. Drink fluids such as:  Water. Do not drink only water. Doing that can lead to having too little salt (sodium) in the body (hyponatremia).  Ice chips.  Fruit juice that you have added water to (diluted fruit juice).  Low-calorie sports drinks.  Avoid:  Alcohol.  Drinks that contain a lot of sugar. These include high-calorie sports drinks, fruit juice that is not diluted, and soda.  Caffeine.  Foods that are greasy or contain a lot of fat or sugar.  Take over-the-counter and prescription medicines only as told by your health care provider.  Do not take sodium tablets. This can lead to having too much sodium in the body (hypernatremia).  Eat foods that contain a healthy balance of electrolytes, such as bananas, oranges, potatoes, tomatoes, and spinach.  Keep all follow-up visits as told by your health care provider. This is important. Contact a health care provider if:  You have abdominal pain that:  Gets worse.  Stays in one area (localizes).  You have a rash.  You have a stiff neck.  You are more irritable than usual.  You are sleepier or more difficult to wake up than usual.  You feel weak or dizzy.  You feel very thirsty.  You have urinated only a small amount of very dark urine over 6-8 hours. Get help right away if:  You have symptoms of severe dehydration.  You cannot drink fluids without vomiting.  Your symptoms get worse with treatment.  You have a fever.  You have a severe headache.  You have vomiting or diarrhea that:  Gets worse.  Does not go away.  You have blood or green matter  (bile) in your vomit.  You have blood in your stool. This may cause stool to look black and tarry.  You have not urinated in 6-8 hours.  You faint.  Your heart rate while sitting still is over 100 beats a minute.  You have trouble breathing. This information is not intended to replace advice given to you by your health care provider. Make sure you discuss any questions you have with your health care provider. Document Released: 09/24/2005 Document Revised: 04/20/2016 Document Reviewed: 11/18/2015 Elsevier Interactive Patient Education  2017 [employer]  Morning Sickness Morning sickness is when you feel sick to your stomach (nauseous) during pregnancy. You may feel sick to your stomach and throw up (vomit). You may feel sick in the morning, but you can feel this way any time of day. Some women feel very sick to their stomach and cannot stop throwing up (hyperemesis gravidarum). Follow these instructions at home:  Only take medicines as told by your doctor.  Take multivitamins as told by your doctor. Taking multivitamins before getting pregnant can stop or lessen the harshness of morning sickness.  Eat dry toast or unsalted crackers before getting out of bed.  Eat 5 to 6 small meals a day.  Eat dry and bland foods like rice and baked potatoes.  Do not drink liquids with meals. Drink between meals.  Do not eat greasy, fatty, or spicy foods.  Have someone cook for you if the smell of food causes you to feel sick or throw up.  If you feel sick to your stomach after taking prenatal vitamins, take them at night or with a snack.  Eat protein when you need a snack (nuts, yogurt, cheese).  Eat unsweetened gelatins for dessert.  Wear a bracelet used for sea sickness (acupressure wristband).  Go to a doctor that puts thin needles into certain body points (acupuncture) to improve how you feel.  Do not smoke.  Use a humidifier to keep the air in your house free of odors.  Get  lots of fresh air. Contact a doctor if:  You need medicine to feel better.  You feel dizzy or lightheaded.  You are losing weight. Get help right away if:  You feel very sick to your stomach and cannot stop throwing up.  You pass out (faint). This information is not intended to replace advice given to you by your health care provider. Make sure you discuss any questions you have with your health care provider. Document Released: 11/01/2004 Document Revised: 03/01/2016 Document Reviewed: 03/11/2013 Elsevier Interactive Patient Education  2017 [employer].

## 2016-09-04 NOTE — MAU Note (Signed)
Pt given ice chips

## 2017-07-10 ENCOUNTER — Encounter (HOSPITAL_COMMUNITY): Payer: Self-pay

## 2019-07-26 ENCOUNTER — Emergency Department (HOSPITAL_COMMUNITY)
Admission: EM | Admit: 2019-07-26 | Discharge: 2019-07-26 | Disposition: A | Discharge status: Home / Self Care | Payer: No Typology Code available for payment source | Attending: Emergency Medicine | Admitting: Emergency Medicine

## 2019-07-26 ENCOUNTER — Other Ambulatory Visit: Payer: Self-pay

## 2019-07-26 ENCOUNTER — Encounter (HOSPITAL_COMMUNITY): Payer: Self-pay

## 2019-07-26 DIAGNOSIS — Y9389 Activity, other specified: Secondary | ICD-10-CM | POA: Insufficient documentation

## 2019-07-26 DIAGNOSIS — M545 Low back pain: Secondary | ICD-10-CM | POA: Insufficient documentation

## 2019-07-26 DIAGNOSIS — Y92410 Unspecified street and highway as the place of occurrence of the external cause: Secondary | ICD-10-CM | POA: Diagnosis not present

## 2019-07-26 DIAGNOSIS — M79601 Pain in right arm: Secondary | ICD-10-CM | POA: Insufficient documentation

## 2019-07-26 DIAGNOSIS — Z79899 Other long term (current) drug therapy: Secondary | ICD-10-CM | POA: Insufficient documentation

## 2019-07-26 DIAGNOSIS — M791 Myalgia, unspecified site: Secondary | ICD-10-CM

## 2019-07-26 DIAGNOSIS — Y999 Unspecified external cause status: Secondary | ICD-10-CM | POA: Insufficient documentation

## 2019-07-26 DIAGNOSIS — M549 Dorsalgia, unspecified: Secondary | ICD-10-CM | POA: Diagnosis not present

## 2019-07-26 DIAGNOSIS — M542 Cervicalgia: Secondary | ICD-10-CM | POA: Insufficient documentation
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
## 2019-07-26 NOTE — Discharge Instructions (Addendum)
Return if any problems.
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
## 2020-09-18 ENCOUNTER — Encounter (HOSPITAL_COMMUNITY): Payer: Self-pay | Admitting: *Deleted

## 2020-09-18 ENCOUNTER — Emergency Department (HOSPITAL_COMMUNITY)
Admission: EM | Admit: 2020-09-18 | Discharge: 2020-09-18 | Disposition: A | Discharge status: Home / Self Care | Payer: HRSA Program | Attending: Emergency Medicine | Admitting: Emergency Medicine

## 2020-09-18 ENCOUNTER — Other Ambulatory Visit: Payer: Self-pay

## 2020-09-18 DIAGNOSIS — U071 COVID-19: Secondary | ICD-10-CM | POA: Insufficient documentation

## 2020-09-18 DIAGNOSIS — R110 Nausea: Secondary | ICD-10-CM | POA: Diagnosis present

## 2020-09-18 LAB — RESP PANEL BY RT-PCR (FLU A&B, COVID) ARPGX2
Influenza A by PCR: NEGATIVE
Influenza B by PCR: NEGATIVE
SARS Coronavirus 2 by RT PCR: POSITIVE — AB

## 2020-09-18 MED ORDER — ONDANSETRON 4 MG PO TBDP: 4.0000 mg | ORAL_TABLET | Freq: Four times a day (QID) | ORAL | 0 refills | Status: DC | PRN

## 2020-09-18 NOTE — ED Provider Notes (Signed)
[facility] EMERGENCY DEPARTMENT Provider Note   CSN: [id number] Arrival date & time: 09/18/20  1734     History Chief Complaint  Patient presents with  . Nausea    Olivia Cooper is a 29 y.o. female.  29 year old female who presents with nausea, fatigue, lightheadedness for the past few days.  Yesterday she lost sense of smell.  No known fevers.  Patient took Covid test at home and was positive she needs to confirm.  No vomiting.  No difficulty breathing.  No abdominal pain.  The history is provided by the patient. No language interpreter was used.  URI Presenting symptoms: congestion and fatigue   Presenting symptoms: no fever   Severity:  Moderate Onset quality:  Sudden Duration:  3 days Timing:  Intermittent Progression:  Unchanged Chronicity:  New Relieved by:  None tried Ineffective treatments:  None tried Associated symptoms: myalgias   Associated symptoms: no neck pain   Risk factors: no recent illness and no recent travel        Past Medical History:  Diagnosis Date  . Medical history non-contributory     Patient Active Problem List   Diagnosis Date Noted  . 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 NO PAST SURGERIES       OB History    Gravida  2   Para  1   Term  1   Preterm      AB      Living  1     SAB      IAB      Ectopic      Multiple  0   Live Births  1           Family History  Problem Relation Age of Onset  . Diabetes Sister   . Hypertension Maternal Grandmother   . Hypertension Paternal Grandmother   . Cancer Paternal Grandmother     Social History   Tobacco Use  . Smoking status: Never Smoker  . Smokeless tobacco: Never Used  Substance Use Topics  . Alcohol use: No  . Drug use: No    Home Medications Prior to Admission medications   Medication Sig Start Date End Date Taking? Authorizing Provider   ondansetron (ZOFRAN ODT) 4 MG disintegrating tablet Take 1 tablet (4 mg total) by mouth every 6 (six) hours as needed for nausea. Use only during day, limit use 09/18/20   Niel Hummer, MD  Prenat-FeAsp-Meth-FA-DHA w/o A (PRENATE PIXIE) 10-0.6-0.4-200 MG CAPS Take 1 capsule by mouth daily.    [provider]  promethazine (PHENERGAN) 25 MG tablet Take 1 tablet (25 mg total) by mouth every 6 (six) hours as needed for nausea or vomiting. Take at night or when you can afford to be sleepy 09/04/16   Aviva Signs, CNM    Allergies    Patient has no known allergies.  Review of Systems   Review of Systems  Constitutional: Positive for fatigue. Negative for fever.  HENT: Positive for congestion.   Musculoskeletal: Positive for myalgias. Negative for neck pain.  All other systems reviewed and are negative.   Physical Exam Updated Vital Signs BP (!) 117/94 (BP Location: Right Arm)   Pulse 79   Temp 98.4 F (36.9 C) (Oral)   Resp 18   LMP 09/04/2020 (Within Days)   SpO2 100%   Physical Exam Vitals and nursing note reviewed.  Constitutional:  Appearance: She is well-developed and well-nourished.  HENT:     Head: Normocephalic and atraumatic.     Right Ear: External ear normal.     Left Ear: External ear normal.     Mouth/Throat:     Mouth: Oropharynx is clear and moist.  Eyes:     Extraocular Movements: EOM normal.     Conjunctiva/sclera: Conjunctivae normal.  Cardiovascular:     Rate and Rhythm: Normal rate.     Pulses: Intact distal pulses.     Heart sounds: Normal heart sounds.  Pulmonary:     Effort: Pulmonary effort is normal.     Breath sounds: Normal breath sounds.  Abdominal:     General: Bowel sounds are normal.     Palpations: Abdomen is soft.     Tenderness: There is no abdominal tenderness. There is no rebound.  Musculoskeletal:        General: Normal range of motion.     Cervical back: Normal range of motion and neck supple.  Skin:     General: Skin is warm.  Neurological:     Mental Status: She is alert and oriented to person, place, and time.     ED Results / Procedures / Treatments   Labs (all labs ordered are listed, but only abnormal results are displayed) Labs Reviewed  RESP PANEL BY RT-PCR (FLU A&B, COVID) [id number]    EKG None  Radiology No results found.  Procedures Procedures (including critical care time)  Medications Ordered in ED Medications - No data to display  ED Course  I have reviewed the triage vital signs and the nursing notes.  Pertinent labs & imaging results that were available during my care of the patient were reviewed by me and considered in my medical decision making (see chart for details).    MDM Rules/Calculators/A&P                          29 year old female who presents for Covid test.  I explained to mother that my specialty is in pediatric emergency medicine but I am happy to see her for evaluation.  If there were any concerns I would address and have her seen by a general ED physician.  Mother comfortable with plan.  Patient with no difficulty breathing.  Mild cough.  Mild congestion.  Patient with myalgias, loss of sense of smell, and taste.  Patient already tested positive for Covid at home.  Will repeat Covid testing.  Patient with normal O2 sats.  Eating and drinking well.  No signs of need for admission.  Discussed need for isolation.  Discussed signs that warrant reevaluation.  Olivia Cooper was evaluated in Emergency Department on 09/18/2020 for the symptoms described in the history of present illness. She was evaluated in the context of the global COVID-19 pandemic, which necessitated consideration that the patient might be at risk for infection with the SARS-CoV-2 virus that causes COVID-19. Institutional protocols and algorithms that pertain to the evaluation of patients at risk for COVID-19 are in a state of rapid change based on information released by regulatory  bodies including the CDC and federal and state organizations. These policies and algorithms were followed during the patient's care in the ED.    Final Clinical Impression(s) / ED Diagnoses Final diagnoses:  COVID-19    Rx / DC Orders ED Discharge Orders         Ordered    ondansetron (ZOFRAN ODT) 4 MG disintegrating  tablet  Every 6 hours PRN        09/18/20 1811           Niel Hummer, MD 09/18/20 2029

## 2020-09-18 NOTE — ED Triage Notes (Signed)
Pt started c/o nausea, fatigue, lightheadedness on Monday but thought it was from a new birth control she was taking.  Yesterday she lost sense of taste and smell.  No fevers.  Took a home covid test and it was positive.  She wants another test to confirm.

## 2020-09-18 NOTE — ED Notes (Signed)
Spoke with pt on the phone and let her know her COVID test was positive.

## 2020-09-19 ENCOUNTER — Telehealth (HOSPITAL_COMMUNITY): Payer: Self-pay

## 2020-09-19 ENCOUNTER — Encounter: Payer: Self-pay | Admitting: Oncology

## 2020-09-19 ENCOUNTER — Telehealth: Payer: Self-pay | Admitting: Oncology

## 2020-09-19 NOTE — Telephone Encounter (Signed)
Called patient to pre-screen for monoclonal antibody infusion after receiving recent positive test. Patient qualifies based off off co-morbid condition and/or member of an at risk group.  Patient Active Problem List   Diagnosis Date Noted  . 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 RN forwarded information to APP's for additional screening/scheduling.   Callaway Hailes Loyola Mast, RN

## 2020-09-19 NOTE — Telephone Encounter (Signed)
Re: Mab Infusion  Called to Discuss with patient about Covid symptoms and the use of regeneron, a monoclonal antibody infusion for those with mild to moderate Covid symptoms and at a high risk of hospitalization.     Pt is qualified for this infusion at the [facility] center due to co-morbid conditions and/or a member of an at-risk group.    Past Medical History:  Diagnosis Date  . Medical history non-contributory     Specific risk condition-SVI-4   Unable to reach pt. NO VM available. Left MCM.  Mignon Pine, AGNP-C [phone] (Infusion Center Hotline)

## 2020-09-20 ENCOUNTER — Emergency Department (HOSPITAL_COMMUNITY): Payer: HRSA Program

## 2020-09-20 ENCOUNTER — Other Ambulatory Visit: Payer: Self-pay

## 2020-09-20 ENCOUNTER — Emergency Department (HOSPITAL_COMMUNITY)
Admission: EM | Admit: 2020-09-20 | Discharge: 2020-09-20 | Disposition: A | Discharge status: Home / Self Care | Payer: HRSA Program | Attending: Emergency Medicine | Admitting: Emergency Medicine

## 2020-09-20 ENCOUNTER — Encounter (HOSPITAL_COMMUNITY): Payer: Self-pay | Admitting: Emergency Medicine

## 2020-09-20 DIAGNOSIS — U071 COVID-19: Secondary | ICD-10-CM | POA: Insufficient documentation

## 2020-09-20 DIAGNOSIS — R079 Chest pain, unspecified: Secondary | ICD-10-CM | POA: Diagnosis present

## 2020-09-20 LAB — CBC WITH DIFFERENTIAL/PLATELET
Abs Immature Granulocytes: 0.01 10*3/uL (ref 0.00–0.07)
Basophils Absolute: 0 10*3/uL (ref 0.0–0.1)
Basophils Relative: 0 %
Eosinophils Absolute: 0 10*3/uL (ref 0.0–0.5)
Eosinophils Relative: 1 %
HCT: 43.5 % (ref 36.0–46.0)
Hemoglobin: 14.5 g/dL (ref 12.0–15.0)
Immature Granulocytes: 0 %
Lymphocytes Relative: 51 %
Lymphs Abs: 2.3 10*3/uL (ref 0.7–4.0)
MCH: 28.5 pg (ref 26.0–34.0)
MCHC: 33.3 g/dL (ref 30.0–36.0)
MCV: 85.6 fL (ref 80.0–100.0)
Monocytes Absolute: 0.2 10*3/uL (ref 0.1–1.0)
Monocytes Relative: 4 %
Neutro Abs: 2 10*3/uL (ref 1.7–7.7)
Neutrophils Relative %: 44 %
Platelets: 238 10*3/uL (ref 150–400)
RBC: 5.08 MIL/uL (ref 3.87–5.11)
RDW: 12.8 % (ref 11.5–15.5)
WBC: 4.5 10*3/uL (ref 4.0–10.5)
nRBC: 0 % (ref 0.0–0.2)

## 2020-09-20 LAB — URINALYSIS, ROUTINE W REFLEX MICROSCOPIC
Bilirubin Urine: NEGATIVE
Glucose, UA: NEGATIVE mg/dL
Hgb urine dipstick: NEGATIVE
Ketones, ur: 5 mg/dL — AB
Leukocytes,Ua: NEGATIVE
Nitrite: NEGATIVE
Protein, ur: 30 mg/dL — AB
Specific Gravity, Urine: 1.03 (ref 1.005–1.030)
pH: 6 (ref 5.0–8.0)

## 2020-09-20 LAB — COMPREHENSIVE METABOLIC PANEL
ALT: 38 U/L (ref 0–44)
AST: 33 U/L (ref 15–41)
Albumin: 4.2 g/dL (ref 3.5–5.0)
Alkaline Phosphatase: 22 U/L — ABNORMAL LOW (ref 38–126)
Anion gap: 11 (ref 5–15)
BUN: 13 mg/dL (ref 6–20)
CO2: 23 mmol/L (ref 22–32)
Calcium: 9.3 mg/dL (ref 8.9–10.3)
Chloride: 104 mmol/L (ref 98–111)
Creatinine, Ser: 0.89 mg/dL (ref 0.44–1.00)
GFR, Estimated: >60 mL/min (ref >60)
Glucose, Bld: 96 mg/dL (ref 70–99)
Potassium: 3.6 mmol/L (ref 3.5–5.1)
Sodium: 138 mmol/L (ref 135–145)
Total Bilirubin: 0.5 mg/dL (ref 0.3–1.2)
Total Protein: 7.3 g/dL (ref 6.5–8.1)

## 2020-09-20 LAB — POC URINE PREG, ED: Preg Test, Ur: NEGATIVE

## 2020-09-20 LAB — PREGNANCY, URINE: Preg Test, Ur: NEGATIVE

## 2020-09-20 MED ORDER — METOCLOPRAMIDE HCL 10 MG PO TABS: 10.0000 mg | ORAL_TABLET | Freq: Four times a day (QID) | ORAL | 0 refills | Status: AC

## 2020-09-20 MED ORDER — METOCLOPRAMIDE HCL 10 MG PO TABS
5.0000 mg | ORAL_TABLET | Freq: Once | ORAL | Status: AC
  Administered 2020-09-20: 15:00:00 5 mg via ORAL
  Filled 2020-09-20: qty 1

## 2020-09-20 NOTE — ED Provider Notes (Signed)
[facility] EMERGENCY DEPARTMENT Provider Note   CSN: [id number] Arrival date & time: 09/20/20  1107     History Chief Complaint  Patient presents with  . Covid Positive  . Abdominal Cramping    Olivia Cooper is a 29 y.o. female.  HPI 29 year old female with no significant medical history presents to the ER with complaints of body aches, nausea, vomiting, abdominal cramping and chest tightness.  Was seen here in the pediatric side with her son 2 days ago and tested positive for Covid.  States this morning she woke up "feeling poorly".  Has had body aches, nonbloody emesis.  No diarrhea.  Last bowel movement was yesterday and normal. Has had a productive cough with clear mucus. No chest pain. No dysuria.  No vaginal discharge or bleeding.  Has felt feverish but no measurable fevers.  Feels like her spine is aching.  Reports she felt poorly so she reported to the ER for further evaluation    Past Medical History:  Diagnosis Date  . Medical history non-contributory     Patient Active Problem List   Diagnosis Date Noted  . 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 NO PAST SURGERIES       OB History    Gravida  2   Para  1   Term  1   Preterm      AB      Living  1     SAB      IAB      Ectopic      Multiple  0   Live Births  1           Family History  Problem Relation Age of Onset  . Diabetes Sister   . Hypertension Maternal Grandmother   . Hypertension Paternal Grandmother   . Cancer Paternal Grandmother     Social History   Tobacco Use  . Smoking status: Never Smoker  . Smokeless tobacco: Never Used  Substance Use Topics  . Alcohol use: No  . Drug use: No    Home Medications Prior to Admission medications   Medication Sig Start Date End Date Taking? Authorizing Provider  norgestimate-ethinyl estradiol (ORTHO-CYCLEN) 0.25-35 MG-MCG tablet Take 1  tablet by mouth daily. 09/06/20  Yes [provider]  metoCLOPramide (REGLAN) 10 MG tablet Take 1 tablet (10 mg total) by mouth every 6 (six) hours. 09/20/20   Mare Ferrari, PA-C    Allergies    Patient has no known allergies.  Review of Systems   Review of Systems  Constitutional: Positive for activity change, appetite change, chills, fatigue and fever.  HENT: Negative for ear pain and sore throat.   Eyes: Negative for pain and visual disturbance.  Respiratory: Positive for cough. Negative for shortness of breath.   Cardiovascular: Negative for chest pain and palpitations.  Gastrointestinal: Positive for nausea and vomiting. Negative for abdominal pain, blood in stool, constipation and diarrhea.  Genitourinary: Negative for dysuria, hematuria, pelvic pain, vaginal bleeding, vaginal discharge and vaginal pain.  Musculoskeletal: Negative for arthralgias and back pain.  Skin: Negative for color change and rash.  Neurological: Positive for weakness. Negative for seizures, syncope and headaches.  All other systems reviewed and are negative.   Physical Exam Updated Vital Signs BP 102/64   Pulse 63   Temp 98.5 F (36.9 C) (Oral)   Resp 15   Ht 5\' 7"  (1.702  m)   Wt 48.5 kg   LMP 09/04/2020 (Within Days)   SpO2 100%   BMI 16.76 kg/m   Physical Exam Vitals and nursing note reviewed.  Constitutional:      General: She is not in acute distress.    Appearance: She is well-developed and well-nourished. She is not toxic-appearing or diaphoretic.  HENT:     Head: Normocephalic and atraumatic.  Eyes:     Conjunctiva/sclera: Conjunctivae normal.  Cardiovascular:     Rate and Rhythm: Normal rate and regular rhythm.     Pulses: Normal pulses.     Heart sounds: No murmur heard.   Pulmonary:     Effort: Pulmonary effort is normal. No respiratory distress.     Breath sounds: Normal breath sounds.  Abdominal:     General: There is no distension.     Palpations: Abdomen is  soft.     Tenderness: There is abdominal tenderness. There is no right CVA tenderness or left CVA tenderness.     Comments: Generalized abdominal upper abdominal tenderness. No LLQ, RLQ or bladder tenderness  Musculoskeletal:        General: No tenderness, deformity or edema.     Cervical back: Neck supple.  Skin:    General: Skin is warm and dry.     Coloration: Skin is not pale.     Findings: No bruising.  Neurological:     General: No focal deficit present.     Mental Status: She is alert and oriented to person, place, and time.     Sensory: No sensory deficit.     Motor: No weakness.  Psychiatric:        Mood and Affect: Mood and affect normal.     ED Results / Procedures / Treatments   Labs (all labs ordered are listed, but only abnormal results are displayed) Labs Reviewed  COMPREHENSIVE METABOLIC PANEL - Abnormal; Notable for the following components:      Result Value   Alkaline Phosphatase 22 (*)    All other components within normal limits  URINALYSIS, ROUTINE W REFLEX MICROSCOPIC - Abnormal; Notable for the following components:   Color, Urine AMBER (*)    APPearance HAZY (*)    Ketones, ur 5 (*)    Protein, ur 30 (*)    Bacteria, UA MANY (*)    All other components within normal limits  CBC WITH DIFFERENTIAL/PLATELET  PREGNANCY, URINE  POC URINE PREG, ED    EKG None  Radiology DG Chest Portable 1 View  Result Date: 09/20/2020 CLINICAL DATA:  Cough. Additional history provided: Patient reports chest pain for 1 week, COVID positive. EXAM: PORTABLE CHEST 1 VIEW COMPARISON:  Prior chest radiographs 06/02/2011. FINDINGS: Heart size within normal limits. No appreciable airspace consolidation.No evidence of pleural effusion or pneumothorax. No acute bony abnormality identified. Mild thoracic dextrocurvature. IMPRESSION: No evidence of active cardiopulmonary disease. Electronically Signed   By: Jackey Loge DO   On: 09/20/2020 13:12    Procedures Procedures  (including critical care time)  Medications Ordered in ED Medications  metoCLOPramide (REGLAN) tablet 5 mg (5 mg Oral Given 09/20/20 1436)    ED Course  I have reviewed the triage vital signs and the nursing notes.  Pertinent labs & imaging results that were available during my care of the patient were reviewed by me and considered in my medical decision making (see chart for details).    MDM Rules/Calculators/A&P  29 year old female presenting with Covid symptoms.  Vitals reassuring, sats 100% consistently here in the ED.  Not tachypneic or tachycardic.  Complains of chest tightness, chest x-ray without evidence of pneumonia.  Low concern for PE.  No audible wheezing on exam.  She is resting comfortably on her phone in the ER bed.  No focal tenderness on abdominal exam, low suspicion for gallstones, diverticulitis, appendicitis, UA, PID, torsion.  Likely consistent with COVID-19.  No flank tenderness.  CBC and CMP unremarkable.  UA without evidence of UTI with some ketones consistent with dehydration, many bacteria but no nitrites or leukocytes, likely due to cross contamination. Pregnancy test is negative.  Patient was informed that her symptoms are likely due to Covid, instructed on supportive measures. Treated with Reglan here, tolerated PO fluids well.  Will send in some Reglan.  Return precautions discussed.  She voiced understanding and is agreeable.  She is uninsured, will direct to [facility] and wellness for further evaluation if her symptoms continue.  This was a shared visit with my supervising physician Dr. Jodi Mourning who independently saw and evaluated the patient & provided guidance in evaluation/management/disposition ,in agreement with care   Final Clinical Impression(s) / ED Diagnoses Final diagnoses:  COVID-19    Rx / DC Orders ED Discharge Orders         Ordered    metoCLOPramide (REGLAN) 10 MG tablet  Every 6 hours        09/20/20  1417           Leone Brand 09/20/20 1536    Blane Ohara, MD 09/21/20 1555

## 2020-09-20 NOTE — ED Triage Notes (Signed)
Pt reports diagnosed with covid on Sunday, reports abdominal cramping and difficulty sleeping. Denies diarrhea.

## 2020-09-20 NOTE — Discharge Instructions (Signed)
Your symptoms are likely due to your recent diagnosis of COVID-19.  Please drink plenty of fluids, quarantine for at least 7 to 10 days, take the nausea medicine provided as needed. Please see the handout on information on how to further manage her symptoms. Please follow-up with a primary care doctor if your symptoms continue.  If you do not have a primary care doctor, I have provided the contact information for [facility] which is a free clinic here in the area.  Return to the ER for any new or worsening symptoms
